# Patient Record
Sex: Male | Born: 1970
Health system: Southern US, Community
[De-identification: ages and names within clinical notes are randomized; demographics above are authoritative.]

## PROBLEM LIST (undated history)

## (undated) DIAGNOSIS — F1011 Alcohol abuse, in remission: Secondary | ICD-10-CM

## (undated) DIAGNOSIS — E7800 Pure hypercholesterolemia, unspecified: Secondary | ICD-10-CM

## (undated) DIAGNOSIS — Z720 Tobacco use: Secondary | ICD-10-CM

## (undated) DIAGNOSIS — K635 Polyp of colon: Secondary | ICD-10-CM

## (undated) DIAGNOSIS — F4310 Post-traumatic stress disorder, unspecified: Secondary | ICD-10-CM

## (undated) DIAGNOSIS — G4700 Insomnia, unspecified: Secondary | ICD-10-CM

## (undated) DIAGNOSIS — F329 Major depressive disorder, single episode, unspecified: Secondary | ICD-10-CM

## (undated) DIAGNOSIS — R569 Unspecified convulsions: Secondary | ICD-10-CM

## (undated) DIAGNOSIS — F1020 Alcohol dependence, uncomplicated: Secondary | ICD-10-CM

## (undated) DIAGNOSIS — K449 Diaphragmatic hernia without obstruction or gangrene: Secondary | ICD-10-CM

## (undated) DIAGNOSIS — G43909 Migraine, unspecified, not intractable, without status migrainosus: Secondary | ICD-10-CM

## (undated) DIAGNOSIS — K2270 Barrett's esophagus without dysplasia: Secondary | ICD-10-CM

## (undated) DIAGNOSIS — G8929 Other chronic pain: Secondary | ICD-10-CM

## (undated) DIAGNOSIS — E781 Pure hyperglyceridemia: Secondary | ICD-10-CM

## (undated) DIAGNOSIS — N400 Enlarged prostate without lower urinary tract symptoms: Secondary | ICD-10-CM

## (undated) DIAGNOSIS — Z8619 Personal history of other infectious and parasitic diseases: Secondary | ICD-10-CM

## (undated) DIAGNOSIS — K5792 Diverticulitis of intestine, part unspecified, without perforation or abscess without bleeding: Secondary | ICD-10-CM

## (undated) DIAGNOSIS — M1990 Unspecified osteoarthritis, unspecified site: Secondary | ICD-10-CM

## (undated) DIAGNOSIS — J309 Allergic rhinitis, unspecified: Secondary | ICD-10-CM

## (undated) DIAGNOSIS — K219 Gastro-esophageal reflux disease without esophagitis: Secondary | ICD-10-CM

## (undated) DIAGNOSIS — I10 Essential (primary) hypertension: Secondary | ICD-10-CM

## (undated) DIAGNOSIS — K5790 Diverticulosis of intestine, part unspecified, without perforation or abscess without bleeding: Secondary | ICD-10-CM

## (undated) DIAGNOSIS — M549 Dorsalgia, unspecified: Secondary | ICD-10-CM

## (undated) DIAGNOSIS — F32A Depression, unspecified: Secondary | ICD-10-CM

## (undated) DIAGNOSIS — M797 Fibromyalgia: Secondary | ICD-10-CM

## (undated) DIAGNOSIS — N189 Chronic kidney disease, unspecified: Secondary | ICD-10-CM

## (undated) DIAGNOSIS — K589 Irritable bowel syndrome without diarrhea: Secondary | ICD-10-CM

## (undated) DIAGNOSIS — G629 Polyneuropathy, unspecified: Secondary | ICD-10-CM

## (undated) HISTORY — DX: Diverticulosis of intestine, part unspecified, without perforation or abscess without bleeding: K57.90

## (undated) HISTORY — DX: Alcohol abuse, in remission: F10.11

## (undated) HISTORY — DX: Diaphragmatic hernia without obstruction or gangrene: K44.9

## (undated) HISTORY — DX: Other chronic pain: G89.29

## (undated) HISTORY — DX: Polyp of colon: K63.5

## (undated) HISTORY — DX: Tobacco use: Z72.0

## (undated) HISTORY — DX: Fibromyalgia: M79.7

## (undated) HISTORY — DX: Unspecified convulsions: R56.9

## (undated) HISTORY — DX: Migraine, unspecified, not intractable, without status migrainosus: G43.909

## (undated) HISTORY — DX: Personal history of other infectious and parasitic diseases: Z86.19

## (undated) HISTORY — DX: Post-traumatic stress disorder, unspecified: F43.10

## (undated) HISTORY — DX: Benign prostatic hyperplasia without lower urinary tract symptoms: N40.0

## (undated) HISTORY — DX: Irritable bowel syndrome, unspecified: K58.9

## (undated) HISTORY — PX: HERNIA REPAIR: SHX51

## (undated) HISTORY — DX: Unspecified osteoarthritis, unspecified site: M19.90

## (undated) HISTORY — DX: Dorsalgia, unspecified: M54.9

## (undated) HISTORY — DX: Allergic rhinitis, unspecified: J30.9

## (undated) HISTORY — DX: Barrett's esophagus without dysplasia: K22.70

## (undated) HISTORY — DX: Pure hyperglyceridemia: E78.1

## (undated) HISTORY — DX: Insomnia, unspecified: G47.00

## (undated) HISTORY — DX: Polyneuropathy, unspecified: G62.9

## (undated) HISTORY — DX: Alcohol dependence, uncomplicated: F10.20

---

## 2004-04-09 ENCOUNTER — Ambulatory Visit: Payer: Self-pay | Admitting: Family Medicine

## 2004-12-13 ENCOUNTER — Emergency Department (HOSPITAL_COMMUNITY): Admission: EM | Admit: 2004-12-13 | Discharge: 2004-12-13 | Payer: Self-pay | Admitting: Emergency Medicine

## 2005-08-10 ENCOUNTER — Emergency Department (HOSPITAL_COMMUNITY): Admission: EM | Admit: 2005-08-10 | Discharge: 2005-08-10 | Payer: Self-pay | Admitting: Family Medicine

## 2005-10-15 ENCOUNTER — Ambulatory Visit: Payer: Self-pay | Admitting: Family Medicine

## 2005-10-29 ENCOUNTER — Ambulatory Visit: Payer: Self-pay | Admitting: Family Medicine

## 2006-11-19 ENCOUNTER — Emergency Department (HOSPITAL_COMMUNITY): Admission: EM | Admit: 2006-11-19 | Discharge: 2006-11-19 | Payer: Self-pay | Admitting: Emergency Medicine

## 2006-12-24 DIAGNOSIS — IMO0001 Reserved for inherently not codable concepts without codable children: Secondary | ICD-10-CM

## 2006-12-24 DIAGNOSIS — F4310 Post-traumatic stress disorder, unspecified: Secondary | ICD-10-CM

## 2006-12-24 DIAGNOSIS — K219 Gastro-esophageal reflux disease without esophagitis: Secondary | ICD-10-CM

## 2006-12-24 DIAGNOSIS — F411 Generalized anxiety disorder: Secondary | ICD-10-CM | POA: Insufficient documentation

## 2006-12-24 DIAGNOSIS — E7800 Pure hypercholesterolemia, unspecified: Secondary | ICD-10-CM | POA: Insufficient documentation

## 2006-12-24 DIAGNOSIS — F17200 Nicotine dependence, unspecified, uncomplicated: Secondary | ICD-10-CM | POA: Insufficient documentation

## 2007-02-22 ENCOUNTER — Emergency Department (HOSPITAL_COMMUNITY): Admission: EM | Admit: 2007-02-22 | Discharge: 2007-02-22 | Payer: Self-pay | Admitting: Emergency Medicine

## 2007-03-23 ENCOUNTER — Emergency Department (HOSPITAL_COMMUNITY): Admission: EM | Admit: 2007-03-23 | Discharge: 2007-03-23 | Payer: Self-pay | Admitting: Emergency Medicine

## 2007-04-07 ENCOUNTER — Ambulatory Visit: Payer: Self-pay | Admitting: Nurse Practitioner

## 2007-04-07 DIAGNOSIS — R197 Diarrhea, unspecified: Secondary | ICD-10-CM

## 2007-04-20 ENCOUNTER — Ambulatory Visit: Payer: Self-pay | Admitting: Family Medicine

## 2007-04-20 LAB — CONVERTED CEMR LAB
ALT: 42 units/L (ref 0–53)
Albumin: 4.3 g/dL (ref 3.5–5.2)
Alkaline Phosphatase: 59 units/L (ref 39–117)
Basophils Absolute: 0 10*3/uL (ref 0.0–0.1)
Basophils Relative: 0 % (ref 0–1)
Bilirubin Urine: NEGATIVE
Blood in Urine, dipstick: NEGATIVE
Eosinophils Absolute: 0.1 10*3/uL (ref 0.0–0.7)
Eosinophils Relative: 2 % (ref 0–5)
Glucose, Bld: 83 mg/dL (ref 70–99)
Glucose, Urine, Semiquant: NEGATIVE
HCT: 47.8 % (ref 39.0–52.0)
Hemoglobin: 15.3 g/dL (ref 13.0–17.0)
Ketones, urine, test strip: NEGATIVE
MCHC: 32 g/dL (ref 30.0–36.0)
MCV: 91.7 fL (ref 78.0–100.0)
Monocytes Absolute: 0.5 10*3/uL (ref 0.1–1.0)
Monocytes Relative: 8 % (ref 3–12)
PSA: 0.42 ng/mL (ref 0.10–4.00)
RBC: 5.21 M/uL (ref 4.22–5.81)
RDW: 14.2 % (ref 11.5–15.5)
Sodium: 141 meq/L (ref 135–145)
Total CHOL/HDL Ratio: 4.5
Triglycerides: 254 mg/dL — ABNORMAL HIGH (ref <150)
Urobilinogen, UA: 0.2
VLDL: 51 mg/dL — ABNORMAL HIGH (ref 0–40)

## 2007-06-17 ENCOUNTER — Ambulatory Visit: Payer: Self-pay | Admitting: Gastroenterology

## 2007-06-17 ENCOUNTER — Encounter (INDEPENDENT_AMBULATORY_CARE_PROVIDER_SITE_OTHER): Payer: Self-pay | Admitting: Family Medicine

## 2007-07-06 ENCOUNTER — Ambulatory Visit: Payer: Self-pay | Admitting: Gastroenterology

## 2007-07-06 ENCOUNTER — Encounter (INDEPENDENT_AMBULATORY_CARE_PROVIDER_SITE_OTHER): Payer: Self-pay | Admitting: Family Medicine

## 2007-07-06 ENCOUNTER — Encounter: Payer: Self-pay | Admitting: Gastroenterology

## 2007-10-18 ENCOUNTER — Telehealth (INDEPENDENT_AMBULATORY_CARE_PROVIDER_SITE_OTHER): Payer: Self-pay | Admitting: *Deleted

## 2008-04-07 ENCOUNTER — Telehealth (INDEPENDENT_AMBULATORY_CARE_PROVIDER_SITE_OTHER): Payer: Self-pay | Admitting: *Deleted

## 2008-11-14 ENCOUNTER — Telehealth (INDEPENDENT_AMBULATORY_CARE_PROVIDER_SITE_OTHER): Payer: Self-pay | Admitting: Family Medicine

## 2009-03-06 ENCOUNTER — Ambulatory Visit: Payer: Self-pay | Admitting: Physician Assistant

## 2009-03-06 DIAGNOSIS — G5600 Carpal tunnel syndrome, unspecified upper limb: Secondary | ICD-10-CM | POA: Insufficient documentation

## 2009-03-06 DIAGNOSIS — R030 Elevated blood-pressure reading, without diagnosis of hypertension: Secondary | ICD-10-CM

## 2009-04-18 ENCOUNTER — Ambulatory Visit: Payer: Self-pay | Admitting: Internal Medicine

## 2009-04-18 ENCOUNTER — Encounter: Payer: Self-pay | Admitting: Physician Assistant

## 2009-04-18 DIAGNOSIS — I10 Essential (primary) hypertension: Secondary | ICD-10-CM | POA: Insufficient documentation

## 2009-04-18 LAB — CONVERTED CEMR LAB
Bilirubin Urine: NEGATIVE
Glucose, Urine, Semiquant: NEGATIVE
Ketones, urine, test strip: NEGATIVE
Protein, U semiquant: NEGATIVE
Specific Gravity, Urine: 1.015
pH: 6

## 2009-04-19 LAB — CONVERTED CEMR LAB
Basophils Absolute: 0 10*3/uL (ref 0.0–0.1)
Basophils Relative: 1 % (ref 0–1)
Calcium: 9.2 mg/dL (ref 8.4–10.5)
Cholesterol: 178 mg/dL (ref 0–200)
Creatinine, Ser: 1 mg/dL (ref 0.40–1.50)
Eosinophils Absolute: 0 10*3/uL (ref 0.0–0.7)
Glucose, Bld: 82 mg/dL (ref 70–99)
Hemoglobin: 14.9 g/dL (ref 13.0–17.0)
Monocytes Absolute: 0.5 10*3/uL (ref 0.1–1.0)
Monocytes Relative: 11 % (ref 3–12)
Neutrophils Relative %: 57 % (ref 43–77)
Platelets: 261 10*3/uL (ref 150–400)
Potassium: 5.5 meq/L — ABNORMAL HIGH (ref 3.5–5.3)
Sodium: 139 meq/L (ref 135–145)
TSH: 1.12 microintl units/mL (ref 0.350–4.500)
Total Bilirubin: 0.7 mg/dL (ref 0.3–1.2)
Total Protein: 7.3 g/dL (ref 6.0–8.3)
WBC: 5 10*3/uL (ref 4.0–10.5)

## 2009-04-25 ENCOUNTER — Ambulatory Visit: Payer: Self-pay | Admitting: Physician Assistant

## 2009-04-26 ENCOUNTER — Telehealth: Payer: Self-pay | Admitting: Physician Assistant

## 2009-04-26 LAB — CONVERTED CEMR LAB
ALT: 270 units/L — ABNORMAL HIGH (ref 0–53)
CO2: 23 meq/L (ref 19–32)
Calcium: 9.2 mg/dL (ref 8.4–10.5)
Chloride: 102 meq/L (ref 96–112)
Cholesterol: 395 mg/dL — ABNORMAL HIGH (ref 0–200)
HDL: 22 mg/dL — ABNORMAL LOW (ref >39)
Sodium: 139 meq/L (ref 135–145)
Total CHOL/HDL Ratio: 18
Triglycerides: 1144 mg/dL — ABNORMAL HIGH (ref <150)

## 2009-04-27 ENCOUNTER — Encounter: Payer: Self-pay | Admitting: Physician Assistant

## 2009-05-01 DIAGNOSIS — R740 Nonspecific elevation of levels of transaminase and lactic acid dehydrogenase [LDH]: Secondary | ICD-10-CM

## 2009-05-02 ENCOUNTER — Ambulatory Visit: Payer: Self-pay | Admitting: Physician Assistant

## 2009-05-02 DIAGNOSIS — E781 Pure hyperglyceridemia: Secondary | ICD-10-CM | POA: Insufficient documentation

## 2009-05-03 LAB — CONVERTED CEMR LAB
ALT: 55 units/L — ABNORMAL HIGH (ref 0–53)
Bilirubin, Direct: 0.2 mg/dL (ref 0.0–0.3)
GGT: 491 units/L — ABNORMAL HIGH (ref 7–51)
HDL: 44 mg/dL (ref >39)
INR: 1.04 (ref <1.50)
Total CHOL/HDL Ratio: 8.5
Triglycerides: 1111 mg/dL — ABNORMAL HIGH (ref <150)

## 2009-05-08 ENCOUNTER — Ambulatory Visit (HOSPITAL_COMMUNITY): Admission: RE | Admit: 2009-05-08 | Discharge: 2009-05-08 | Payer: Self-pay | Admitting: Internal Medicine

## 2009-05-09 DIAGNOSIS — K7689 Other specified diseases of liver: Secondary | ICD-10-CM

## 2009-05-17 ENCOUNTER — Ambulatory Visit: Payer: Self-pay | Admitting: Physician Assistant

## 2009-05-17 DIAGNOSIS — E739 Lactose intolerance, unspecified: Secondary | ICD-10-CM

## 2009-05-17 LAB — CONVERTED CEMR LAB
HDL goal, serum: 40 mg/dL
LDL Goal: 130 mg/dL

## 2009-06-05 ENCOUNTER — Encounter: Payer: Self-pay | Admitting: Physician Assistant

## 2009-06-05 ENCOUNTER — Ambulatory Visit: Payer: Self-pay | Admitting: Physician Assistant

## 2009-06-06 ENCOUNTER — Encounter: Payer: Self-pay | Admitting: Physician Assistant

## 2009-06-07 LAB — CONVERTED CEMR LAB
AST: 41 units/L — ABNORMAL HIGH (ref 0–37)
CO2: 21 meq/L (ref 19–32)
Calcium: 9.3 mg/dL (ref 8.4–10.5)
Chloride: 99 meq/L (ref 96–112)
Cholesterol: 338 mg/dL — ABNORMAL HIGH (ref 0–200)
Potassium: 4.3 meq/L (ref 3.5–5.3)
Sodium: 136 meq/L (ref 135–145)
Total Bilirubin: 1.6 mg/dL — ABNORMAL HIGH (ref 0.3–1.2)
Triglycerides: 963 mg/dL — ABNORMAL HIGH (ref <150)

## 2009-08-07 ENCOUNTER — Ambulatory Visit: Payer: Self-pay | Admitting: Physician Assistant

## 2009-08-08 ENCOUNTER — Encounter: Payer: Self-pay | Admitting: Physician Assistant

## 2009-08-09 LAB — CONVERTED CEMR LAB
ALT: 35 units/L (ref 0–53)
Albumin: 4.5 g/dL (ref 3.5–5.2)
CO2: 21 meq/L (ref 19–32)
Creatinine, Ser: 1.11 mg/dL (ref 0.40–1.50)
Glucose, Bld: 78 mg/dL (ref 70–99)
HDL: 66 mg/dL (ref >39)
Total Bilirubin: 0.9 mg/dL (ref 0.3–1.2)
Total CHOL/HDL Ratio: 4

## 2009-08-10 ENCOUNTER — Encounter: Payer: Self-pay | Admitting: Physician Assistant

## 2009-08-10 ENCOUNTER — Encounter (INDEPENDENT_AMBULATORY_CARE_PROVIDER_SITE_OTHER): Payer: Self-pay | Admitting: *Deleted

## 2009-08-14 ENCOUNTER — Encounter: Payer: Self-pay | Admitting: Physician Assistant

## 2009-09-06 ENCOUNTER — Telehealth: Payer: Self-pay | Admitting: Physician Assistant

## 2009-10-18 ENCOUNTER — Telehealth: Payer: Self-pay | Admitting: Physician Assistant

## 2009-11-16 ENCOUNTER — Telehealth (INDEPENDENT_AMBULATORY_CARE_PROVIDER_SITE_OTHER): Payer: Self-pay | Admitting: *Deleted

## 2009-11-16 ENCOUNTER — Ambulatory Visit: Payer: Self-pay | Admitting: Physician Assistant

## 2009-11-16 DIAGNOSIS — IMO0002 Reserved for concepts with insufficient information to code with codable children: Secondary | ICD-10-CM

## 2009-11-16 LAB — CONVERTED CEMR LAB
ALT: 21 units/L (ref 0–53)
AST: 23 units/L (ref 0–37)
Albumin: 4.4 g/dL (ref 3.5–5.2)
Alkaline Phosphatase: 52 units/L (ref 39–117)
Creatinine, Ser: 0.77 mg/dL (ref 0.40–1.50)
Hgb A1c MFr Bld: 5.3 %
LDL Cholesterol: 107 mg/dL — ABNORMAL HIGH (ref 0–99)
Total Bilirubin: 0.6 mg/dL (ref 0.3–1.2)
Total Protein: 6.9 g/dL (ref 6.0–8.3)
VLDL: 25 mg/dL (ref 0–40)

## 2009-11-18 ENCOUNTER — Telehealth (INDEPENDENT_AMBULATORY_CARE_PROVIDER_SITE_OTHER): Payer: Self-pay | Admitting: *Deleted

## 2009-11-18 ENCOUNTER — Encounter: Payer: Self-pay | Admitting: Physician Assistant

## 2009-12-14 ENCOUNTER — Encounter: Payer: Self-pay | Admitting: Physician Assistant

## 2010-01-01 ENCOUNTER — Telehealth: Payer: Self-pay | Admitting: Physician Assistant

## 2010-01-18 ENCOUNTER — Telehealth (INDEPENDENT_AMBULATORY_CARE_PROVIDER_SITE_OTHER): Payer: Self-pay | Admitting: Nurse Practitioner

## 2010-03-08 ENCOUNTER — Emergency Department (HOSPITAL_COMMUNITY)
Admission: EM | Admit: 2010-03-08 | Discharge: 2010-03-08 | Payer: Self-pay | Source: Home / Self Care | Admitting: Emergency Medicine

## 2010-04-04 ENCOUNTER — Ambulatory Visit: Admit: 2010-04-04 | Payer: Self-pay | Admitting: Nurse Practitioner

## 2010-04-23 NOTE — Letter (Signed)
Summary: REFERRAL//DIABETIC CLINIC//APPT DATE & TIME  REFERRAL//DIABETIC CLINIC//APPT DATE & TIME   Imported By: Arta Bruce 07/10/2009 14:58:19  _____________________________________________________________________  External Attachment:    Type:   Image     Comment:   External Document

## 2010-04-23 NOTE — Assessment & Plan Note (Signed)
Summary: F/U BP///////RJP   Vital Signs:  Patient profile:   40 year old male Height:      70.25 inches Weight:      203 pounds BMI:     29.03 Temp:     97.8 degrees F oral Pulse rate:   75 / minute Pulse rhythm:   regular Resp:     18 per minute BP sitting:   117 / 73  (left arm) Cuff size:   regular  Vitals Entered By: [place] Shannon (May 17, 2009 11:15 AM) CC: f/u bp...., Hypertension Management, Lipid Management Is Patient Diabetic? No  Does patient need assistance? Functional Status Self care Ambulation Normal   Primary Care Provider:  Tereso Newcomer PA-C  CC:  f/u bp...., Hypertension Management, and Lipid Management.  History of Present Illness: Here for f/u. In follow up of all his tests recently, his LFTs were up as well as his triglycerides (>1000). His u/s showed fatty liver. His A1C is 6. I suspect he likely has metabolic syndrome. He is now on Tricor.  He had some nausea and diarrhea at first.  But, this has resolved. He always has this with new meds. He has stopped using table salt. 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  No longer eating there. He has changed his diet. He is due to see our dietician soon.  No appt yet. He is walking more and is trying to lose weight.   Hypertension History:      He denies headache, chest pain, palpitations, dyspnea with exertion, peripheral edema, and syncope.        Positive major cardiovascular risk factors include hyperlipidemia, hypertension, and family history for ischemic heart disease (females less than 91 years old & males less than 60 years old).  Negative major cardiovascular risk factors include male age less than 54 years old, no history of diabetes, and non-tobacco-user status.        Further assessment for target organ damage reveals no history of ASHD, stroke/TIA, or peripheral vascular disease.    Lipid Management History:      Positive NCEP/ATP III risk factors include  family history for ischemic heart disease (females less than 38 years old & males less than 21 years old) and hypertension.  Negative NCEP/ATP III risk factors include male age less than 47 years old, non-diabetic, non-tobacco-user status, no ASHD (atherosclerotic heart disease), no prior stroke/TIA, no peripheral vascular disease, and no history of aortic aneurysm.     Current Medications (verified): 1)  Prilosec 20 Mg  Cpdr (Omeprazole) .Marland Kitchen.. 1 By Mouth Once Daily 2)  Alprazolam 2 Mg  Tabs (Alprazolam) .... Take 1 By Mouth Daily 3)  Prozac 20 Mg  Caps (Fluoxetine Hcl) .... Take One Tablet Daily 4)  Ambien 10 Mg  Tabs (Zolpidem Tartrate) .... Take 1/2-1 Tablet By Mouth At Bedtime Every Other Day 5)  Trazodone Hcl 50 Mg  Tabs (Trazodone Hcl) .... Take 1 By Mouth Every Other Day 6)  Bilateral Carpal Tunnel Braces .... Wear Every Night To Bed 7)  Norvasc 5 Mg Tabs (Amlodipine Besylate) .... Take 1 Tablet By Mouth Once A Day For Blood Pressure 8)  Tricor 145 Mg Tabs (Fenofibrate) .... Take 1 Tablet By Mouth Once A Day For Cholesterol  Allergies (verified): 1)  ! Percocet 2)  ! Vicodin  Past History:  Past Medical History: PTSD Fibromyalgia Right exotropia Quit Tobacco 02/27/2005 GERD   a.  EGD 06/2007:  Barrets Esophagus; Hiatal Hernia h/o hematochezia  a.  [place]. 06/2007:  Diverticulosis Fatty Liver (ultrasound 04/2009) Hypertriglyceridemia Glucose intolerance (A1C 6.0 04/2009)   a.  ? metabolic syndrome  Physical Exam  General:  alert, well-developed, and well-nourished.   Head:  normocephalic and atraumatic.   Lungs:  normal breath sounds, no crackles, and no wheezes.   Heart:  normal rate and regular rhythm.   Abdomen:  soft, non-tender, and no hepatomegaly.   Neurologic:  alert & oriented X3 and cranial nerves II-XII intact.   Psych:  normally interactive.     Impression & Recommendations:  Problem # 1:  HYPERTRIGLYCERIDEMIA (ICD-272.1) recheck lipids and LFTs after 1 mo of  therapy  His updated medication list for this problem includes:    Tricor 145 Mg Tabs (Fenofibrate) .Marland Kitchen... Take 1 tablet by mouth once a day for cholesterol  Problem # 2:  FATTY LIVER DISEASE (ICD-571.8) as above discussed diet treat chol f/u on sugars  Orders: Diabetic Clinic Referral (Diabetic)  Problem # 3:  HYPERTENSION (ICD-401.9) better patient watching diet and exercising   His updated medication list for this problem includes:    Norvasc 5 Mg Tabs (Amlodipine besylate) .Marland Kitchen... Take 1 tablet by mouth once a day for blood pressure  Problem # 4:  GLUCOSE INTOLERANCE (ICD-271.3) ? metabolic syndrome consider metformin if his A1C does not go down advised him to take ASA 81 mg once daily given his FHx, HTN, cholesterol, etc. check A1C at f/u visit  Complete Medication List: 1)  Prilosec 20 Mg Cpdr (Omeprazole) .Marland Kitchen.. 1 by mouth once daily 2)  Alprazolam 2 Mg Tabs (Alprazolam) .... Take 1 by mouth daily 3)  Prozac 20 Mg Caps (Fluoxetine hcl) .... Take one tablet daily 4)  Ambien 10 Mg Tabs (Zolpidem tartrate) .... Take 1/2-1 tablet by mouth at bedtime every other day 5)  Trazodone Hcl 50 Mg Tabs (Trazodone hcl) .... Take 1 by mouth every other day 6)  Bilateral Carpal Tunnel Braces  .... Wear every night to bed 7)  Norvasc 5 Mg Tabs (Amlodipine besylate) .... Take 1 tablet by mouth once a day for blood pressure 8)  Tricor 145 Mg Tabs (Fenofibrate) .... Take 1 tablet by mouth once a day for cholesterol  Hypertension Assessment/Plan:      The patient's hypertensive risk group is category B: At least one risk factor (excluding diabetes) with no target organ damage.  Today's blood pressure is 117/73.  His blood pressure goal is < 140/90.  Lipid Assessment/Plan:      Based on NCEP/ATP III, the patient's risk factor category is "2 or more risk factors and a calculated 10 year CAD risk of > 20%".  The patient's lipid goals are as follows: Total cholesterol goal is 200; LDL cholesterol  goal is 130; HDL cholesterol goal is 40; Triglyceride goal is 150.     Patient Instructions: 1)  Schedule appointment with Drucilla Schmidt for education on diet. 2)  Return to the lab on or around March 15 for: CMET, Lipids. 3)  Come fasting for labs (nothing to eat or drink after midnight except water). 4)  Please schedule a follow-up appointment in 3 months with Scott for blood pressure and cholesterol.  We will check your hemoglobin A1C again that day (test for your sugar).  5)  It is important that you exercise reguarly at least 20 minutes 5 times a week. If you develop chest pain, have severe difficulty breathing, or feel very tired, stop exercising immediately and seek medical attention.  Vital Signs:  Patient profile:   40 year old male Height:      70.25 inches Weight:      203 pounds BMI:     29.03 Temp:     97.8 degrees F oral Pulse rate:   75 / minute Pulse rhythm:   regular Resp:     18 per minute BP sitting:   117 / 73  (left arm) Cuff size:   regular  Vitals Entered By: [place] Shannon (May 17, 2009 11:15 AM)

## 2010-04-23 NOTE — Progress Notes (Signed)
Summary: PT REFERRAL   Phone Note Outgoing Call   Summary of Call: Schedule repeat FLP and LFTs in 6 mos. Initial call taken by: Brynda Rim,  November 18, 2009 9:07 PM  Follow-up for Phone Call        PT HAVE AN APPT [facility]  12-04-09 @ 8:30AM LVM TO PT TO RETURN MY CALL Follow-up by: Cheryll Dessert,  November 20, 2009 12:44 PM

## 2010-04-23 NOTE — Letter (Signed)
Summary: Letter//lab letter  Letter//lab letter   Imported By: Arta Bruce 08/10/2009 11:28:54  _____________________________________________________________________  External Attachment:    Type:   Image     Comment:   External Document

## 2010-04-23 NOTE — Progress Notes (Signed)
Summary: OMEPRAZOLE REFILL   Phone Note From Other Clinic   Caller: [facility] Summary of Call: CALLED TO GET REFILL FOR OMEPRAZOLE 20 MG LAST FILLED 07/30/2009. [phone] Initial call taken by: Levon Hedger,  September 06, 2009 5:40 PM  Follow-up for Phone Call        Rx in basket to fax to his pharmacy. Follow-up by: Tereso Newcomer PA-C,  September 07, 2009 8:58 AM  Additional Follow-up for Phone Call Additional follow up Details #1::        Rx faxed to [facility]. Gaylyn Cheers RN  September 07, 2009 11:53 AM     Prescriptions: PRILOSEC 20 MG  CPDR (OMEPRAZOLE) 1 by mouth once daily  #30 x 11   Entered and Authorized by:   Tereso Newcomer PA-C   Signed by:   Tereso Newcomer PA-C on 09/07/2009   Method used:   Printed then faxed to ...       [facility] (retail)       [address]       [address]       [place], [place]  [address]       Ph: [phone]       Fax: [phone]   RxID:   [id number]

## 2010-04-23 NOTE — Assessment & Plan Note (Signed)
Summary: discuss labs///cns   Vital Signs:  Patient profile:   40 year old male Height:      70.25 inches Weight:      193 pounds BMI:     27.60 Temp:     98.0 degrees F oral Pulse rate:   102 / minute Pulse rhythm:   regular Resp:     18 per minute BP sitting:   133 / 88  (left arm) Cuff size:   regular  Vitals Entered By: [place] Shannon (May 02, 2009 10:49 AM) CC: pt says while on the bp med he feels like he has no energy....  pt said when first starting the bp med that his chest was hurting but its not any more.... pt says the med is getting better..., Hypertension Management Is Patient Diabetic? No Pain Assessment Patient in pain? no       Does patient need assistance? Functional Status Self care Ambulation Normal   CC:  pt says while on the bp med he feels like he has no energy....  pt said when first starting the bp med that his chest was hurting but its not any more.... pt says the med is getting better... and Hypertension Management.  History of Present Illness: 40 year old male presents for followup.  As noted in the chart his blood work came back from his physical with a mildly elevated AST.  His triglycerides were significantly elevated at 644.  His LDL was not calculated.  Followup blood work demonstrated a triglyceride level of 1144.  His liver enzymes were also significantly elevated with an AST of 234 and an ALT of 270.  He had a hepatitis panel obtained.  Of note, this was an acute hepatitis panel.  This was negative for hepatitis B surface antigen, hepatitis B core antibody IgM and hepatitis C antibody.  In the office today, he notes that he is feeling well.  He did have some side effects to the Norvasc when he first started it.  He felt somewhat fatigued and also noted some atypical chest pain.  He denies any exertional chest discomfort.  He denies any exertional shortness of breath.  He denies any associated nausea or diaphoresis.  He denies any associated  radiating symptoms.  The symptoms have since subsided since he started the medication.  He is now taking the Norvasc at bedtime and feels better.  He denies any nausea or vomiting.  He denies any flulike symptoms.  He denies any fevers or chills.  He denies any diarrhea.  He does note that his diet is poor.  He does eat a lot of carbohydrates.  He has cut way back on his alcohol since I last spoke to him.  Hypertension History:      He complains of chest pain, but denies dyspnea with exertion and syncope.        Positive major cardiovascular risk factors include hyperlipidemia, hypertension, and family history for ischemic heart disease (females less than 27 years old & males less than 82 years old).  Negative major cardiovascular risk factors include male age less than 2 years old and non-tobacco-user status.    Current Medications (verified): 1)  Prilosec 20 Mg  Cpdr (Omeprazole) .Marland Kitchen.. 1 By Mouth Once Daily 2)  Alprazolam 2 Mg  Tabs (Alprazolam) .... Take 1 By Mouth Daily 3)  Prozac 20 Mg  Caps (Fluoxetine Hcl) .... Take One Tablet Daily 4)  Ambien 10 Mg  Tabs (Zolpidem Tartrate) .... Take 1/2-1 Tablet By Mouth  At Bedtime Every Other Day 5)  Trazodone Hcl 50 Mg  Tabs (Trazodone Hcl) .... Take 1 By Mouth Every Other Day 6)  Bilateral Carpal Tunnel Braces .... Wear Every Night To Bed 7)  Norvasc 5 Mg Tabs (Amlodipine Besylate) .... Take 1 Tablet By Mouth Once A Day For Blood Pressure  Allergies (verified): 1)  ! Percocet 2)  ! Vicodin  Family History: Mother living HTN Father died at 53 DM,MI,alcoholic 1 brother living 78 Marfans 3 sisters...2 with DM,1 with lupus, one sis had a stroke at 48 No h/o colon cancer Paternal uncles ?prostate cancer. Family History of CAD Male 1st degree relative <50 (died at 69 of MI) Family History of CAD Male 1st degree relative <50  Physical Exam  General:  alert, well-developed, and well-nourished.   Head:  normocephalic and atraumatic.   Neck:  no  jvd Lungs:  normal breath sounds, no crackles, and no wheezes.   Heart:  normal rate and regular rhythm.   Abdomen:  soft, non-tender, normal bowel sounds, no distention, no guarding, no rebound tenderness, no hepatomegaly, and no splenomegaly.   Neurologic:  alert & oriented X3 and cranial nerves II-XII intact.   Psych:  normally interactive.     Impression & Recommendations:  Problem # 1:  TRANSAMINASES, SERUM, ELEVATED (ICD-790.4) suspect he has fatty liver  recheck labs today abdominal ultrasound ordered   Orders: T-Hepatic Function [phone]) T-Gamma GT (GGT) [phone]) T-Protime, Auto [phone]) T- Hemoglobin A1C ([id number]) T-Lipase (44010-27253) T-Amylase (66440-34742)  Problem # 2:  HYPERTRIGLYCERIDEMIA (ICD-272.1) as above discussed his diet with him at length today rec. he decrease carb intake look into getting the [facility] Diet book will likely start on statin or fibrate has a strong FHx of vascular disease await further w/u before recommending +/- daily ASA  Orders: T-Hepatic Function [phone]) T-Gamma GT (GGT) [phone]) T-Protime, Auto [phone]) T- Hemoglobin A1C (09323-55732) T-Lipase (20254-27062) T-Amylase (37628-31517) T-Lipid Profile (61607-37106)  Problem # 3:  HYPERTENSION (ICD-401.9) bp better had some side effects initially but getting better and seems to be tolerating the med.  His updated medication list for this problem includes:    Norvasc 5 Mg Tabs (Amlodipine besylate) .Marland Kitchen... Take 1 tablet by mouth once a day for blood pressure  Complete Medication List: 1)  Prilosec 20 Mg Cpdr (Omeprazole) .Marland Kitchen.. 1 by mouth once daily 2)  Alprazolam 2 Mg Tabs (Alprazolam) .... Take 1 by mouth daily 3)  Prozac 20 Mg Caps (Fluoxetine hcl) .... Take one tablet daily 4)  Ambien 10 Mg Tabs (Zolpidem tartrate) .... Take 1/2-1 tablet by mouth at bedtime every other day 5)  Trazodone Hcl 50 Mg Tabs (Trazodone hcl) .... Take 1 by mouth  every other day 6)  Bilateral Carpal Tunnel Braces  .... Wear every night to bed 7)  Norvasc 5 Mg Tabs (Amlodipine besylate) .... Take 1 tablet by mouth once a day for blood pressure  Hypertension Assessment/Plan:      The patient's hypertensive risk group is category B: At least one risk factor (excluding diabetes) with no target organ damage.  Today's blood pressure is 133/88.  His blood pressure goal is < 140/90.   Patient Instructions: 1)  Follow up with Uvaldo Rybacki as scheduled. 2)  Follow a low cholesterol diet. 3)  Try to get the book:  "The [facility] Diet." 4)  Avoid alcohol. 5)  Avoid Motrin, etc. for now. 6)  Use tylenol in very limited amounts as needed.  You could take a maximum  of 500 mg 4 times a day at this point.  Appended Document: discuss labs///cns Patient: Russell Adams Note: All result statuses are Final unless otherwise noted.  Tests: (1) PT (Prothrombin Time) (66440)  PT (Prothrombin Time)                             13.5 seconds                11.6-15.2   INR                       1.04                        <1.50     The INR is of principal utility in following patients on stable doses     of oral anticoagulants.  The therapeutic range is generally 2.0 to     3.0, but may be 3.0 to 4.0 in patients with mechanical cardiac valves,     recurrent embolisms and antiphospholipid antibodies (including lupus     inhibitors).  Tests: (2) Lipid Profile (34742)   Cholesterol          [H]  375 mg/dL                   5-956     ATP III Classification:           < 200        mg/dL        Desirable          200 - 239     mg/dL        Borderline High          >= 240        mg/dL        High         Triglyceride         [H]  1111 mg/dL                  <387     Result repeated and verified.     Result confirmed by automatic dilution.   HDL Cholesterol           44 mg/dL                    >56   Total Chol/HDL Ratio      8.5 Ratio  VLDL Cholesterol (Calc)                              NOT CALC mg/dL              4-33           Not calculated due to Triglyceride >400.     Suggest ordering Direct LDL (Unit Code: 29518).  LDL Cholesterol (Calc)                             NOT CALC mg/dL              8-41           Not calculated due to Triglyceride >400.     Suggest ordering Direct LDL (Unit Code: 66063).           Total Cholesterol/HDL Ratio:CHD Risk  Coronary Heart Disease Risk Table                                            Men       Women              1/2 Average Risk              3.4        3.3                  Average Risk              5.0        4.4              2 X Average Risk              9.6        7.1              3 X Average Risk             23.4       11.0     Use the calculated Patient Ratio above and the CHD Risk table      to determine the patient's CHD Risk.     ATP III Classification (LDL):           < 100        mg/dL         Optimal          100 - 129     mg/dL         Near or Above Optimal          130 - 159     mg/dL         Borderline High          160 - 189     mg/dL         High           > 190        mg/dL         Very High        Tests: (3) Liver Profile (04540)   Bilirubin, Total     [H]  1.3 mg/dL                   9.8-1.1   Bilirubin, Direct         0.2 mg/dL                   9.1-4.7   Indirect Bilirubin   [H]  1.1 mg/dL                   8.2-9.5   Alkaline Phosphatase      95 U/L                      39-117   AST/SGOT             [H]  40 U/L                      0-37   ALT/SGPT             [H]  55 U/L  0-53   Total Protein             8.2 g/dL                    1.6-1.0   Albumin                   4.6 g/dL                    9.6-0.4  Tests: (4) Gamma GT (GGT) (23130)   Gamma GT (GGT)       [H]  491 U/L                     7-51  Tests: (5) Amylase (23210)   Amylase                   44 U/L                      0-105  Tests: (6) Lipase (54098)   Lipase                     40 U/L                      0-75  Tests: (7) Hemoglobin A1C (23375)   Hemoglobin A1C            6.0 %                       4.6-6.1     The ADA recommends the following therapeutic goal for glycemic control     related to Hgb A1c measurement:     Goal of therapy: <6.5 Hgb A1c           Reference: American Diabetes Association: Clinical Practice     Recommendations 2010, Diabetes Care, 2010, 33: (Suppl 1). ! Estimated Average Glucose                             126 mg/dL                   None avail  Note: An exclamation mark (!) indicates a result that was not dispersed into the flowsheet. Document Creation Date: 05/03/2009 1:03 AM _______________________________________________________________________  (1) Order result status: Final Collection or observation date-time: 05/02/2009 21:46 Requested date-time: 05/02/2009 12:31 Receipt date-time: 05/02/2009 21:46 Reported date-time: 05/03/2009 01:03 Referring Physician:   Ordering Physician:  Alben Spittle [phone]) Specimen Source:  Source: Lajean Silvius Order Number: [id number] Lab site: [facility], [facility]     [address], [address]     [place]  [place]  [address]  (2) Order result status: Final Collection or observation date-time: 05/02/2009 21:46 Requested date-time: 05/02/2009 12:31 Receipt date-time: 05/02/2009 21:46 Reported date-time: 05/03/2009 01:03 Referring Physician:   Ordering Physician:  Alben Spittle [phone]) Specimen Source:  Source: Lajean Silvius Order Number: [id number] Lab site: [facility], [facility]     [address], [address]     [place]  [place]  [address]  (3) Order result status: Final Collection or observation date-time: 05/02/2009 21:46 Requested date-time: 05/02/2009 12:31 Receipt date-time: 05/02/2009 21:46 Reported date-time: 05/03/2009 01:03 Referring Physician:   Ordering Physician:  Alben Spittle [phone]) Specimen Source:  Source: Lajean Silvius Order Number: [id number] Lab  site: [facility], [facility]     [address], [address]     [place]  [place]  [address]  (4) Order result status: Final Collection or observation date-time: 05/02/2009 21:46 Requested date-time: 05/02/2009 12:31 Receipt date-time: 05/02/2009 21:46 Reported date-time: 05/03/2009 01:03 Referring Physician:   Ordering Physician:  Alben Spittle [phone]) Specimen Source:  Source: Lajean Silvius Order Number: [id number] Lab site: [facility], [facility]     [address]     [place]  [place]  [address]  (5) Order result status: Final Collection or observation date-time: 05/02/2009 21:46 Requested date-time: 05/02/2009 12:31 Receipt date-time: 05/02/2009 21:46 Reported date-time: 05/03/2009 01:03 Referring Physician:   Ordering Physician:  Alben Spittle [phone]) Specimen Source:  Source: Lajean Silvius Order Number: [id number] Lab site: [facility], [facility]     [address], [address]     [place]  [place]  [address]  (6) Order result status: Final Collection or observation date-time: 05/02/2009 21:46 Requested date-time: 05/02/2009 12:31 Receipt date-time: 05/02/2009 21:46 Reported date-time: 05/03/2009 01:03 Referring Physician:   Ordering Physician:  Alben Spittle [phone]) Specimen Source:  Source: Lajean Silvius Order Number: [id number] Lab site: [facility], [facility]     [address]     [place]  [place]  [address]  (7) Order result status: Final Collection or observation date-time: 05/02/2009 21:46 Requested date-time: 05/02/2009 12:31 Receipt date-time: 05/02/2009 21:46 Reported date-time: 05/03/2009 01:03 Referring Physician:   Ordering Physician:  Alben Spittle [phone]) Specimen Source:  Source: [facility] Order Number: [id number] Lab site: [facility], [facility]     [address], [address]     [place]  [place]  [address]   -----------------  The following non-numeric lab results were dispersed to the flowsheet even  though numeric results were expected:    VLDL Cholesterol (Calc), NOT CALC   LDL Cholesterol (Calc), NOT CALC   Signed by Tereso Newcomer PA-C on 05/03/2009 at 5:37 PM  ________________________________________________________________________  LFTs better Chol and Trigs still way out of control INR normal Lipase ok A1C 6 . . . Glucose Intol (?metabolic syndrome) Will start on Tricor . . . will likely add fish oil as well May end up adding a statin as well. Refer to dietician for intensive lifestyle changes Cont to manage BP Consider metformin if A1C remains above 6 Ultrasound still pending (?fatty liver)   [place], I want to start him on Tricor 145 mg once daily. He will need fasting Lipids and LFTs in 4 weeks. Find out what pharmacy to send to. Make sure he does his ultrasound. Have him see Susie Piper to discuss diet.  Let him know that he is glucose intolerant. Tell him his LFTs are better.  Trig's still over 1000.  . . so needs treatment. Make sure he keeps f/u later this month to discuss.     Clinical Lists Changes

## 2010-04-23 NOTE — Letter (Signed)
Summary: *HSN Results Follow up  [facility]  [address] [place], [place] [address]   Phone: [phone]  Fax: [phone]      08/10/2009   KAZUMI LACHNEY [address] [place], [place]  [address]   Dear  Mr. Marilynn Latino,                            ____S.Drinkard,FNP   __X__S. Alben Spittle, PA       ____B. McPherson,MD   ____V. Rankins,MD    ____E. Mulberry,MD    ____N. Daphine Deutscher, FNP  ____D. Reche Dixon, MD    ____K. Philipp Deputy, MD    ____Other     This letter is to inform you that your recent test(s):  _______Pap Smear    __X_____Lab Test     _______X-ray    _______ is within acceptable limits  _______ requires a medication change  ____X__requires a follow-up lab visit  _______ requires a follow-up visit with your provider   Comments:  Your Triglycerides look much better -- continue Tricor.  Your provider wants you to have follow-up labs done in three months.  Start on Fish oil 1000 International Units,  1 capsule once daily for 4 weeks, then increase to 2 caps once daily (you can get over the counter). When taking other medications, take this medication separately.  I've made an appointment for you to have the labs done on November 13, 2009 at 8:30 am.  If this appointment needs to be changed, please notify our office.     _________________________________________________________ If you have any questions, please contact our office                     Sincerely,  Dutch Quint RN [facility]

## 2010-04-23 NOTE — Progress Notes (Signed)
Summary: PT referral   Phone Note Outgoing Call   Summary of Call: Refer to PT for right shoulder pain Initial call taken by: Brynda Rim,  November 16, 2009 11:54 AM  Follow-up for Phone Call        SEND A REFERRAL WAITING FOR AN APPT   Follow-up by: Cheryll Dessert,  November 20, 2009 5:18 PM

## 2010-04-23 NOTE — Letter (Signed)
Summary: *HSN Results Follow up  [facility]  [address] [place], [place] [address]   Phone: [phone]  Fax: [phone]      11/18/2009   Russell Adams [address] [place], [place]  [address]   Dear  Mr. Russell Adams,                            ____S.Drinkard,FNP   ____D. Gore,FNP       ____B. McPherson,MD   ____V. Rankins,MD    ____E. Mulberry,MD    ____N. Daphine Deutscher, FNP  ____D. Reche Dixon, MD    ____K. Philipp Deputy, MD    __x__S. Alben Spittle, PA-C     This letter is to inform you that your recent test(s):  _______Pap Smear    ___x____Lab Test     _______X-ray    _______ is normal  _______ requires a medication change  _______ requires a follow-up lab visit  _______ requires a follow-up visit with your provider   Comments: Your cholesterol numbers look great!!  Your Total cholesterol is 182, Triglycerides 125 (!!!!), HDL 50 and LDL 107.  Your liver enzymes are normal.  This is awesome.  Continue your same medicines and keep up the good work.  Congratulations!       _________________________________________________________ If you have any questions, please contact our office                     Sincerely,  Tereso Newcomer PA-C [facility]

## 2010-04-23 NOTE — Assessment & Plan Note (Signed)
Summary: OV per Lorin Picket, FLP,LFT//mm   Vital Signs:  Patient profile:   40 year old male Height:      70.25 inches Weight:      195 pounds BMI:     27.88 Temp:     98.1 degrees F oral Pulse rate:   76 / minute Pulse rhythm:   regular Resp:     18 per minute BP sitting:   110 / 82  (left arm) Cuff size:   regular  Vitals Entered By: Armenia Shannon (November 16, 2009 10:37 AM) CC: pt says his right shoulder is hurting all the time.... pt says he gets a shock in his body.pt says he woudl like a muscle relaxer or referral for PT.... pt says his allergies is brothering him..., Hypertension Management Is Patient Diabetic? No Pain Assessment Patient in pain? no       Does patient need assistance? Functional Status Self care Ambulation Normal   Primary Care Provider:  Tereso Newcomer PA-C  CC:  pt says his right shoulder is hurting all the time.... pt says he gets a shock in his body.pt says he woudl like a muscle relaxer or referral for PT.... pt says his allergies is brothering him... and Hypertension Management.  History of Present Illness: Here for f/u.  Lost 8 pounds. Has a lot of allergies and using zyrtec.  This is helping a lot.  No wheezing.  Coughing but this is better. No fevers or chills.   C/o right shoulder pain for about 6 months now.  Points to his trapezius muscle.  No injury.  Raising his arm above his head makes worse.  No radicular symptoms.  No neck pain.  Has tried tylenol without success.  Has tried to use a tennis ball to massage his shoulder.   Hypertension History:      He denies chest pain, dyspnea with exertion, and syncope.        Positive major cardiovascular risk factors include hyperlipidemia, hypertension, and family history for ischemic heart disease (females less than 54 years old & males less than 53 years old).  Negative major cardiovascular risk factors include male age less than 86 years old, no history of diabetes, and non-tobacco-user status.     Further assessment for target organ damage reveals no history of ASHD, stroke/TIA, or peripheral vascular disease.     Current Medications (verified): 1)  Prilosec 20 Mg  Cpdr (Omeprazole) .Marland Kitchen.. 1 By Mouth Once Daily 2)  Alprazolam 2 Mg  Tabs (Alprazolam) .... Take 1 By Mouth Daily 3)  Prozac 20 Mg  Caps (Fluoxetine Hcl) .... Take Two Tablet Daily 4)  Ambien 10 Mg  Tabs (Zolpidem Tartrate) .... Take 1 Tablet By Mouth At Bedtime Every Other Day 5)  Trazodone Hcl 50 Mg  Tabs (Trazodone Hcl) .... Take 1 By Mouth Every Other Day 6)  Bilateral Carpal Tunnel Braces .... Wear Every Night To Bed 7)  Norvasc 5 Mg Tabs (Amlodipine Besylate) .... Take 1 Tablet By Mouth Once A Day For Blood Pressure 8)  Tricor 145 Mg Tabs (Fenofibrate) .... Take 1 Tablet By Mouth Once A Day For Cholesterol  Allergies (verified): 1)  ! Percocet 2)  ! Vicodin  Physical Exam  General:  alert, well-developed, and well-nourished.   Head:  normocephalic and atraumatic.   Eyes:  no xanthelasma Neck:  supple.   Lungs:  normal breath sounds.   Heart:  normal rate and regular rhythm.   Msk:  right shoulder: + pain over  right trap muscle with palp full A/P ROM no AC joint tend no bicipital groove tend no crepitus  Extremities:  no edema Neurologic:  alert & oriented X3 and cranial nerves II-XII intact biceps and brachioradialis DTRs 2+ bilat strength in BUE normal and equal in all muscle groups bilat    Psych:  normally interactive.     Impression & Recommendations:  Problem # 1:  HYPERTRIGLYCERIDEMIA (ICD-272.1) not taking fish oil . . .cannot afford tol meds has lost weight and changed diet  His updated medication list for this problem includes:    Tricor 145 Mg Tabs (Fenofibrate) .Marland Kitchen... Take 1 tablet by mouth once a day for cholesterol  Orders: T-Comprehensive Metabolic Panel [id number]) T-Lipid Profile ([address]073-71062)  Problem # 2:  HYPERTENSION (ICD-401.9) controlled  His updated medication list  for this problem includes:    Norvasc 5 Mg Tabs (Amlodipine besylate) .Marland Kitchen... Take 1 tablet by mouth once a day for blood pressure  Problem # 3:  SHOULDER STRAIN, RIGHT (ICD-840.9)  chronic trapezius strain NSAIDs, muscle relaxers, ice/heat with chronicity, will send to PT  Orders: Physical Therapy Referral (PT)  Complete Medication List: 1)  Prilosec 20 Mg Cpdr (Omeprazole) .Marland Kitchen.. 1 by mouth once daily 2)  Alprazolam 2 Mg Tabs (Alprazolam) .... Take 1 by mouth daily 3)  Prozac 20 Mg Caps (Fluoxetine hcl) .... Take two tablet daily 4)  Ambien 10 Mg Tabs (Zolpidem tartrate) .... Take 1 tablet by mouth at bedtime every other day 5)  Trazodone Hcl 50 Mg Tabs (Trazodone hcl) .... Take 1 by mouth every other day 6)  Bilateral Carpal Tunnel Braces  .... Wear every night to bed 7)  Norvasc 5 Mg Tabs (Amlodipine besylate) .... Take 1 tablet by mouth once a day for blood pressure 8)  Tricor 145 Mg Tabs (Fenofibrate) .... Take 1 tablet by mouth once a day for cholesterol 9)  Naproxen 500 Mg Tabs (Naproxen) .... Take 1 tablet by mouth two times a day with food for 3 days, then two times a day with food as needed 10)  Flexeril 10 Mg Tabs (Cyclobenzaprine hcl) .... Take 1 tab by mouth at bedtime for muscle pain or spasm  Other Orders: Capillary Blood Glucose/CBG (69485) Hemoglobin A1C (46270)  Hypertension Assessment/Plan:      The patient's hypertensive risk group is category B: At least one risk factor (excluding diabetes) with no target organ damage.  Today's blood pressure is 110/82.  His blood pressure goal is < 140/90.  Patient Instructions: 1)  Apply heat for 15 minutes three times a day to your shoulder. 2)  Use tylenol as needed. 3)  Use the Naproxen just for 3 days.  Then, take it as needed.  Try to avoid using it too much because it can aggravate your stomach or make your BP go up. 4)  Use the flexeril at bedtime as needed .  It will make you sleepy. 5)  Please schedule a follow-up  appointment in 4 months with Scott for blood pressure and cholesterol.  Prescriptions: FLEXERIL 10 MG TABS (CYCLOBENZAPRINE HCL) Take 1 tab by mouth at bedtime for muscle pain or spasm  #30 x 0   Entered and Authorized by:   Tereso Newcomer PA-C   Signed by:   Tereso Newcomer PA-C on 11/16/2009   Method used:   Print then Give to Patient   RxID:   [id number] NAPROXEN 500 MG TABS (NAPROXEN) Take 1 tablet by mouth two times a day with food for 3  days, then two times a day with food as needed  #30 x 0   Entered and Authorized by:   Tereso Newcomer PA-C   Signed by:   Tereso Newcomer PA-C on 11/16/2009   Method used:   Print then Give to Patient   RxID:   [id number]   Laboratory Results   Blood Tests     HGBA1C: 5.3%   (Normal Range: Non-Diabetic - 3-6%   Control Diabetic - 6-8%)

## 2010-04-23 NOTE — Letter (Signed)
Summary: MAILED REQUESTED RECORDS TO [facility]  MAILED REQUESTED RECORDS TO [facility]   Imported By: Arta Bruce 12/14/2009 12:12:31  _____________________________________________________________________  External Attachment:    Type:   Image     Comment:   External Document

## 2010-04-23 NOTE — Progress Notes (Signed)
Summary: refill Norvasc  Phone Note Refill Request  on January 21, 2010 11:10 AM  Refills Requested: Medication #1:  NORVASC 5 MG TABS Take 1 tablet by mouth once a day for blood pressure   Dosage confirmed as above?Dosage Confirmed   Last Refilled: 12/17/2009 [facility]  Initial call taken by: Armenia Shannon,  January 18, 2010 4:48 PM  Follow-up for Phone Call        faxed to [facility] Gaylyn Cheers RN  January 21, 2010 11:15 AM     Prescriptions: NORVASC 5 MG TABS (AMLODIPINE BESYLATE) Take 1 tablet by mouth once a day for blood pressure  #30 x 3   Entered and Authorized by:   Gaylyn Cheers RN   Signed by:   Gaylyn Cheers RN on 01/21/2010   Method used:   Print then Give to Patient   RxID:   [id number]

## 2010-04-23 NOTE — Progress Notes (Signed)
   Phone Note Call from Patient   Summary of Call: spoke with Russell Adams and he said he feels better now since the Bp med has got into his body... Russell Adams says NO to fever, chills, nausea, vomiting, diarrhea, and stomach pains...Marland KitchenMarland Kitchen Russell Adams says he is not drinking as much as he was when he first came here... its only on the weekends when he drinks.... Russell Adams says he was fasting for labs he dd not eat anything after 5/6pm cause he went to bed at 8pm.... Russell Adams med list is correct to the list we have Initial call taken by: Armenia Shannon,  April 26, 2009 4:49 PM  Follow-up for Phone Call        see lab comments  Follow-up by: Tereso Newcomer PA-C,  April 26, 2009 5:39 PM
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
## 2010-04-23 NOTE — Letter (Signed)
Summary: MED/SOLUTIONS//APPROVED  MED/SOLUTIONS//APPROVED   Imported By: Arta Bruce 06/26/2009 14:46:03  _____________________________________________________________________  External Attachment:    Type:   Image     Comment:   External Document
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
## 2010-04-23 NOTE — Letter (Signed)
Summary: NUTRITION SUMMARY//SUSIE  NUTRITION SUMMARY//SUSIE   Imported By: Arta Bruce 08/23/2009 11:47:51  _____________________________________________________________________  External Attachment:    Type:   Image     Comment:   External Document

## 2010-04-23 NOTE — Progress Notes (Signed)
Summary: TRICOR NEEDS REFILLING  Phone Note Call from Patient Call back at Home Phone [phone]   Reason for Call: Refill Medication Summary of Call: Russell Adams PT. Russell Adams HAS BEEN TRYING TO GET HIS TRICOR REFILLED SINCE LAST WEEK. HE USES [facility]. @ [phone] Initial call taken by: Leodis Rains,  January 01, 2010 12:41 PM  Follow-up for Phone Call        Rx faxed to [facility] - pt. notified.  Dutch Quint RN  January 01, 2010 2:45 PM     Prescriptions: TRICOR 145 MG TABS (FENOFIBRATE) Take 1 tablet by mouth once a day for cholesterol  #30 x 3   Entered by:   Dutch Quint RN   Authorized by:   Tereso Newcomer PA-C   Signed by:   Dutch Quint RN on 01/01/2010   Method used:   Printed then faxed to ...       [facility] (retail)       [address] [place]       [address]       [place], [place]  [address]       Ph: [phone]       Fax: [phone]   RxID:   [id number]

## 2010-04-23 NOTE — Letter (Signed)
Summary: CALL A NURSE //CANCEL APPT  CALL A NURSE //CANCEL APPT   Imported By: Arta Bruce 08/15/2009 12:07:37  _____________________________________________________________________  External Attachment:    Type:   Image     Comment:   External Document

## 2010-05-24 ENCOUNTER — Telehealth (INDEPENDENT_AMBULATORY_CARE_PROVIDER_SITE_OTHER): Payer: Self-pay | Admitting: Nurse Practitioner

## 2010-06-03 LAB — RAPID URINE DRUG SCREEN, HOSP PERFORMED
Amphetamines: NOT DETECTED
Cocaine: NOT DETECTED
Opiates: NOT DETECTED
Tetrahydrocannabinol: NOT DETECTED

## 2010-06-03 LAB — COMPREHENSIVE METABOLIC PANEL
AST: 28 U/L (ref 0–37)
Albumin: 4.2 g/dL (ref 3.5–5.2)
BUN: 15 mg/dL (ref 6–23)
Creatinine, Ser: 1.05 mg/dL (ref 0.4–1.5)
GFR calc non Af Amer: >60 mL/min (ref >60)
Total Protein: 7.5 g/dL (ref 6.0–8.3)
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
## 2010-08-06 NOTE — Assessment & Plan Note (Signed)
[facility]                         GASTROENTEROLOGY OFFICE NOTE   INAKI, VANTINE                      MRN:          [id number]  DATE:06/17/2007                            DOB:          1970-08-31    REFERRING PHYSICIAN:  Turkey R. Rankins, M.D.   REFERRING PHYSICIAN:  Turkey R. Rankins, M.D.   REASON FOR CONSULTATION:  GERD and hematochezia.   HISTORY OF PRESENT ILLNESS:  Mr. Coate is a 40 year old white male  who relates acid reflux problems since age 64. He took Tagamet on a  regular basis at that time, and over the past 10 years or so he has been  maintained on daily Prilosec with very good control of his reflux  symptoms. He has rare episodes of breakthrough that are generally at  night. He notes mid sternal chest burning when his reflux symptoms are  active. He notes no dysphagia or odynophagia. He relates occasional  small amounts of bright red blood per rectum with bowel movements, and  these symptoms have occurred intermittently over the past several years.  He notes no change in bowel habits, change in stool caliber, diarrhea,  or constipation. He stopped smoking a couple years ago, and his weight  increased from 200 pounds to 240 pounds and with diet and exercise his  weight has now decreased to 187 pounds. Both his sister and mother have  had colon polyps.   PAST MEDICAL HISTORY:  1. Hypertension, diet-controlled.  2. Hyperlipidemia, diet-controlled.  3. Depression.  4. Anxiety.  5. Panic disorder.  6. Allergic rhinitis.  7. GERD.   PAST SURGICAL HISTORY:  Negative.   MEDICATION ALLERGIES:  VICODIN leading to a rash.   CURRENT MEDICATIONS:  Listed on the chart and reviewed.   SOCIAL HISTORY:  Per the handwritten form.   REVIEW OF SYSTEMS:  Per the handwritten form.   PHYSICAL EXAMINATION:  GENERAL:  Well-developed, well-nourished white  male in no acute distress. Height 6 feet; weight 187.2 pounds.  VITAL  SIGNS:  Blood pressure 124/74, pulse 72 and regular.  HEENT:  Anicteric sclerae. Oropharynx:  Clear.  CHEST:  Clear to auscultation bilaterally.  CARDIAC:  Regular rate and rhythm without murmurs appreciated.  ABDOMEN:  Soft, nontender, nondistended, normoactive bowel sounds. No  palpable organomegaly, masses, or hernias.  RECTAL:  Deferred to time of sigmoidoscopy.  EXTREMITIES:  Without clubbing, cyanosis, or edema.  NEUROLOGIC:  Alert and oriented x3. Grossly nonfocal.   ASSESSMENT AND PLAN:  1. Chronic gastroesophageal reflux disease. Rule out Barrett's      esophagus and erosive esophagitis. Risks, benefits, and      alternatives to upper endoscopy and possible biopsy discussed with      the patient, and he consents to proceed. This will be scheduled      electively. He was given standard information on anti-reflux      measures, maintain Prilosec 20 mg p.o. q.a.m. taken 30 minutes      before breakfast.  2. Intermittent small volume hematochezia and a family history of      colon polyps. We will plan to proceed  with flexible sigmoidoscopy      for further evaluation. Risks, benefits, and alternatives to      flexible sigmoidoscopy with possible biopsy and possible      destruction of internal hemorrhoids discussed with the patient, and      he consents to proceed. This will be scheduled electively.     Venita Lick. Russella Dar, MD, [facility]  Electronically Signed    MTS/MedQ  DD: 06/17/2007  DT: 06/17/2007  Job #: [id number]   cc:   Fanny Dance. Rankins, M.D.

## 2010-10-09 ENCOUNTER — Other Ambulatory Visit: Payer: Self-pay | Admitting: Internal Medicine

## 2010-10-09 DIAGNOSIS — R1011 Right upper quadrant pain: Secondary | ICD-10-CM

## 2010-10-15 ENCOUNTER — Other Ambulatory Visit (HOSPITAL_COMMUNITY): Payer: Self-pay

## 2011-06-17 ENCOUNTER — Other Ambulatory Visit (HOSPITAL_COMMUNITY): Payer: Self-pay | Admitting: Internal Medicine

## 2011-06-17 DIAGNOSIS — R1011 Right upper quadrant pain: Secondary | ICD-10-CM

## 2011-06-19 ENCOUNTER — Other Ambulatory Visit (HOSPITAL_COMMUNITY): Payer: Self-pay

## 2011-06-26 ENCOUNTER — Ambulatory Visit (HOSPITAL_COMMUNITY)
Admission: RE | Admit: 2011-06-26 | Discharge: 2011-06-26 | Disposition: A | Discharge status: Home / Self Care | Payer: Medicaid Other | Source: Ambulatory Visit | Attending: Internal Medicine | Admitting: Internal Medicine

## 2011-06-26 DIAGNOSIS — R1011 Right upper quadrant pain: Secondary | ICD-10-CM | POA: Insufficient documentation

## 2012-01-28 DIAGNOSIS — F17210 Nicotine dependence, cigarettes, uncomplicated: Secondary | ICD-10-CM | POA: Insufficient documentation

## 2012-01-28 DIAGNOSIS — M797 Fibromyalgia: Secondary | ICD-10-CM | POA: Insufficient documentation

## 2012-06-30 ENCOUNTER — Encounter: Payer: Self-pay | Admitting: Gastroenterology

## 2013-03-04 DIAGNOSIS — N400 Enlarged prostate without lower urinary tract symptoms: Secondary | ICD-10-CM | POA: Insufficient documentation

## 2013-09-19 ENCOUNTER — Emergency Department (HOSPITAL_COMMUNITY)
Admission: EM | Admit: 2013-09-19 | Discharge: 2013-09-19 | Disposition: A | Discharge status: Home / Self Care | Payer: Medicaid Other | Attending: Emergency Medicine | Admitting: Emergency Medicine

## 2013-09-19 ENCOUNTER — Encounter (HOSPITAL_COMMUNITY): Payer: Self-pay | Admitting: Emergency Medicine

## 2013-09-19 DIAGNOSIS — K089 Disorder of teeth and supporting structures, unspecified: Secondary | ICD-10-CM | POA: Insufficient documentation

## 2013-09-19 DIAGNOSIS — K219 Gastro-esophageal reflux disease without esophagitis: Secondary | ICD-10-CM | POA: Insufficient documentation

## 2013-09-19 DIAGNOSIS — K0889 Other specified disorders of teeth and supporting structures: Secondary | ICD-10-CM

## 2013-09-19 DIAGNOSIS — I10 Essential (primary) hypertension: Secondary | ICD-10-CM | POA: Insufficient documentation

## 2013-09-19 DIAGNOSIS — Z79899 Other long term (current) drug therapy: Secondary | ICD-10-CM | POA: Insufficient documentation

## 2013-09-19 DIAGNOSIS — F17200 Nicotine dependence, unspecified, uncomplicated: Secondary | ICD-10-CM | POA: Insufficient documentation

## 2013-09-19 DIAGNOSIS — F329 Major depressive disorder, single episode, unspecified: Secondary | ICD-10-CM | POA: Insufficient documentation

## 2013-09-19 DIAGNOSIS — E7800 Pure hypercholesterolemia, unspecified: Secondary | ICD-10-CM | POA: Insufficient documentation

## 2013-09-19 DIAGNOSIS — F3289 Other specified depressive episodes: Secondary | ICD-10-CM | POA: Insufficient documentation

## 2013-09-19 HISTORY — DX: Essential (primary) hypertension: I10

## 2013-09-19 HISTORY — DX: Depression, unspecified: F32.A

## 2013-09-19 HISTORY — DX: Major depressive disorder, single episode, unspecified: F32.9

## 2013-09-19 HISTORY — DX: Diverticulitis of intestine, part unspecified, without perforation or abscess without bleeding: K57.92

## 2013-09-19 HISTORY — DX: Gastro-esophageal reflux disease without esophagitis: K21.9

## 2013-09-19 HISTORY — DX: Pure hypercholesterolemia, unspecified: E78.00

## 2013-09-19 MED ORDER — PENICILLIN V POTASSIUM 500 MG PO TABS: 500.0000 mg | ORAL_TABLET | Freq: Four times a day (QID) | ORAL | Status: AC

## 2013-09-19 MED ORDER — TRAMADOL HCL 50 MG PO TABS: 50.0000 mg | ORAL_TABLET | Freq: Four times a day (QID) | ORAL | Status: DC | PRN

## 2013-09-19 NOTE — Discharge Instructions (Signed)
You have a dental injury. Use the resource guide listed below to help you find a dentist if you do not already have one to followup with. It is very important that you get evaluated by a dentist as soon as possible. Call tomorrow to schedule an appointment. Use your pain medication as prescribed and do not operate heavy machinery while on pain medication. Take your full course of antibiotics. Read the instructions below. ° °Eat a soft or liquid diet and rinse your mouth out after meals with warm water. You should see a dentist or return here at once if you have increased swelling, increased pain or uncontrolled bleeding from the site of your injury. ° ° °SEEK MEDICAL CARE IF:  °· You have increased pain not controlled with medicines.  °· You have swelling around your tooth, in your face or neck.  °· You have bleeding which starts, continues, or gets worse.  °· You have a fever >101 °· If you are unable to open your mouth ° °RESOURCE GUIDE ° °Dental Problems ° °Patients with Medicaid: °[facility]                     [facility] °[address]                                           [address] °Phone:  [phone]                                                  Phone:  510-2600 ° °If unable to pay or uninsured, contact:  [facility] or [facility] to become qualified for the adult dental clinic. ° °Chronic Pain Problems °Contact [facility]  [phone] °Patients need to be referred by their primary care doctor. ° °Insufficient Money for Medicine °Contact [employer]:  call "211" or [facility] 271-5999. ° °No Primary Care Doctor °Call [facility]  832-8000 °Other agencies that provide inexpensive medical care °   [facility]  832-8035 °   [facility]  832-7272 °   [facility]  271-5999 °   [facility]  832-4777 °   [facility]  373-0678 °   [facility]   [phone] ° °Psychological Services °[facility]  [phone] °[facility]  [phone] °[facility]   800 853-5163 (emergency services 641-4993) ° °Substance Abuse Resources °[facility]  336-882-2125 °[facility] 336-784-9470 °[facility] 336-285-9073 °[facility] 336-845-3988 °Residential & Outpatient Substance Abuse Program  800-659-3381 ° °Abuse/Neglect °[place] [facility] (336) 641-3795 °[place] [facility] Hot[facility] 800-378-5315 (After Hours) ° °Emergency Shelter °[facility] (336) 271-5985 ° °Maternity Homes °[facility] (336) 275-9566 °[facility] (704) 372-4663 ° °MRSA Hotline #:   832-7006 ° ° ° °[place] [facility] ° °[facility] [place] [facility]     [employer]                          [facility] °[address] [place]                         [address]      [address]  °[place]                                                [place]                            [place] °Phone:  349-3220                                   Phone:  342-7768                 Phone:  342-8140 ° °[facility] °Phone:  342-8316 ° °[place] [facility] Hotline °(336) 342-1394 °(336) 342-3537 (After Hours) ° ° ° ° °

## 2013-09-19 NOTE — ED Provider Notes (Signed)
CSN: [id number]     Arrival date & time 09/19/13  0908 History  This chart was scribed for a non-physician practitioner, Hyman Bible, working with Arbie Cookey, by Cathie Hoops, ED Scribe. The patient was seen in TR07C/TR07C. The patient's care was started at 9:23 AM.    Chief Complaint  Patient presents with  . Dental Pain    The history is provided by the patient. No language interpreter was used.   HPI Comments: Russell Adams is a 43 y.o. male who presents to the Emergency Department complaining of constant, gradually worsened right lower dental pain that began 4 days ago. Patient states the dental pain has progressed to a bilateral HA. Patient states he has used Sensodyne and Oragel with minimal relief. He has a regular dentist, but was unable to be seen due to lack of transportation. Patient denies fever, difficulty swallowing, neck stiffness, trismus, nausea, or vomiting.  Past Medical History  Diagnosis Date  . Hypertension   . Hypercholesterolemia   . GERD (gastroesophageal reflux disease)   . Diverticulitis   . Depression    Past Surgical History  Procedure Laterality Date  . Hernia repair     No family history on file. History  Substance Use Topics  . Smoking status: Current Every Day Smoker  . Smokeless tobacco: Not on file  . Alcohol Use: No    Review of Systems  Constitutional: Negative for fever.  HENT: Positive for dental problem. Negative for facial swelling and trouble swallowing.   Gastrointestinal: Negative for nausea and vomiting.  Musculoskeletal: Negative for neck pain and neck stiffness.  All other systems reviewed and are negative.     Allergies  Review of patient's allergies indicates no active allergies.  Home Medications   Prior to Admission medications   Medication Sig Start Date End Date Taking? Authorizing Provider  fenofibrate (TRICOR) 145 MG tablet Take 145 mg by mouth daily.   Yes Historical Provider, MD    lisinopril-hydrochlorothiazide (PRINZIDE,ZESTORETIC) 10-12.5 MG per tablet Take 1 tablet by mouth daily.   Yes Historical Provider, MD  omeprazole (PRILOSEC) 20 MG capsule Take 60 mg by mouth 2 (two) times daily.   Yes Historical Provider, MD  sertraline (ZOLOFT) 50 MG tablet Take 50 mg by mouth daily.   Yes Historical Provider, MD  zolpidem (AMBIEN) 10 MG tablet Take 15 mg by mouth at bedtime as needed for sleep.   Yes Historical Provider, MD   Triage Vitals: BP 142/88  Pulse 104  Temp(Src) 97 F (36.1 C) (Oral)  Resp 18  Ht 6' (1.829 m)  Wt 200 lb (90.719 kg)  BMI 27.12 kg/m2  SpO2 98% Physical Exam  Nursing note and vitals reviewed. Constitutional: He is oriented to person, place, and time. He appears well-developed and well-nourished.  HENT:  Head: Normocephalic and atraumatic.  Mouth/Throat: Oropharynx is clear and moist. No trismus in the jaw. No dental abscesses.    Tender to palpation over the right lower gingiva. No obvious dental abscess. No sublingual tenderness or swelling. No submandibular or submental swelling.   Eyes: Conjunctivae and EOM are normal.  Neck: Normal range of motion. Neck supple.  Cardiovascular: Normal rate, regular rhythm and normal heart sounds.   Pulmonary/Chest: Effort normal and breath sounds normal.  Abdominal: He exhibits no distension.  Neurological: He is alert and oriented to person, place, and time.  Skin: Skin is warm and dry.  Psychiatric: He has a normal mood and affect.    ED Course  Procedures (including critical care time)  DIAGNOSTIC STUDIES: Oxygen Saturation is 98% on room air, normal by my interpretation.    COORDINATION OF CARE: 9:27 AM- Will prescribe antibiotics. Patient advised to see regular dentist. Patient informed of current plan for treatment and evaluation and agrees with plan at this time.  Labs Review Labs Reviewed - No data to display  Imaging Review No results found.   EKG Interpretation None       MDM   Final diagnoses:  None   Patient with toothache.  No gross abscess.  Exam unconcerning for Ludwig's angina or spread of infection.  Will treat with penicillin and pain medicine.  Urged patient to follow-up with dentist.  Patient stable for discharge.  Return precautions given.  I personally performed the services described in this documentation, which was scribed in my presence. The recorded information has been reviewed and is accurate.    Hyman Bible, PA-C 09/19/13 1213

## 2013-09-21 NOTE — ED Provider Notes (Signed)
Medical screening examination/treatment/procedure(s) were performed by non-physician practitioner and as supervising physician I was immediately available for consultation/collaboration.   Houston Siren III, MD 09/21/13 1037

## 2013-10-15 ENCOUNTER — Encounter (HOSPITAL_COMMUNITY): Payer: Self-pay | Admitting: Emergency Medicine

## 2013-10-15 ENCOUNTER — Emergency Department (HOSPITAL_COMMUNITY)
Admission: EM | Admit: 2013-10-15 | Discharge: 2013-10-15 | Disposition: A | Discharge status: Home / Self Care | Payer: Medicaid Other | Attending: Emergency Medicine | Admitting: Emergency Medicine

## 2013-10-15 DIAGNOSIS — F3289 Other specified depressive episodes: Secondary | ICD-10-CM | POA: Diagnosis not present

## 2013-10-15 DIAGNOSIS — K029 Dental caries, unspecified: Secondary | ICD-10-CM | POA: Insufficient documentation

## 2013-10-15 DIAGNOSIS — K006 Disturbances in tooth eruption: Secondary | ICD-10-CM | POA: Insufficient documentation

## 2013-10-15 DIAGNOSIS — K219 Gastro-esophageal reflux disease without esophagitis: Secondary | ICD-10-CM | POA: Insufficient documentation

## 2013-10-15 DIAGNOSIS — K0889 Other specified disorders of teeth and supporting structures: Secondary | ICD-10-CM

## 2013-10-15 DIAGNOSIS — F329 Major depressive disorder, single episode, unspecified: Secondary | ICD-10-CM | POA: Diagnosis not present

## 2013-10-15 DIAGNOSIS — K089 Disorder of teeth and supporting structures, unspecified: Secondary | ICD-10-CM | POA: Insufficient documentation

## 2013-10-15 DIAGNOSIS — Z79899 Other long term (current) drug therapy: Secondary | ICD-10-CM | POA: Insufficient documentation

## 2013-10-15 DIAGNOSIS — I10 Essential (primary) hypertension: Secondary | ICD-10-CM | POA: Diagnosis not present

## 2013-10-15 DIAGNOSIS — E7800 Pure hypercholesterolemia, unspecified: Secondary | ICD-10-CM | POA: Diagnosis not present

## 2013-10-15 DIAGNOSIS — F17200 Nicotine dependence, unspecified, uncomplicated: Secondary | ICD-10-CM | POA: Insufficient documentation

## 2013-10-15 MED ORDER — LIDOCAINE VISCOUS 2 % MT SOLN
15.0000 mL | Freq: Once | OROMUCOSAL | Status: AC
  Administered 2013-10-15: 15 mL via OROMUCOSAL
  Filled 2013-10-15: qty 15

## 2013-10-15 MED ORDER — OXYCODONE-ACETAMINOPHEN 5-325 MG PO TABS: 1.0000 | ORAL_TABLET | Freq: Four times a day (QID) | ORAL | Status: DC | PRN

## 2013-10-15 MED ORDER — OXYCODONE-ACETAMINOPHEN 5-325 MG PO TABS
1.0000 | ORAL_TABLET | Freq: Once | ORAL | Status: AC
  Administered 2013-10-15: 1 via ORAL
  Filled 2013-10-15: qty 1

## 2013-10-15 NOTE — ED Notes (Signed)
Pt. Stated, I had 4 teeth pulled on Tuesday and I think I have a couple of dried sockets.

## 2013-10-15 NOTE — ED Provider Notes (Signed)
CSN: [id number]     Arrival date & time 10/15/13  1146 History   This chart was scribed for non-physician practitioner,Rosio Weiss, PA-C working with Sharyon Cable, MD, by Erling Conte, ED Scribe. This patient was seen in room TR05C/TR05C and the patient's care was started at 12:34 PM.    Chief Complaint  Patient presents with  . Dental Pain      The history is provided by the patient. No language interpreter was used.   HPI Comments: Russell Adams is a 43 y.o. male who presents to the Emergency Department complaining of moderate, constant right sided dental pain since Tuesday. Patient states that he had 4 teeth that were abscessed  pulled on Tuesday by Dr. Romilda Garret. Patient states he is worried that he had a couple of dried sockets. Patient states he was given Hydrocodone for the pain that he has been taking 1 every 4-6 hours with mild relief. He states that he is out of the prescribed Hydrocodone. He states he also has been rinsing with a prescribed mouth rinse and prescribed Amoxicillin. He reports that he has an appt with Dr. Romilda Garret on Monday. He denies any drainage from his gums, fever, chills, or difficulty swallowing.     Past Medical History  Diagnosis Date  . Hypertension   . Hypercholesterolemia   . GERD (gastroesophageal reflux disease)   . Diverticulitis   . Depression    Past Surgical History  Procedure Laterality Date  . Hernia repair     No family history on file. History  Substance Use Topics  . Smoking status: Current Every Day Smoker  . Smokeless tobacco: Not on file  . Alcohol Use: No    Review of Systems  Constitutional: Negative for fever and chills.  HENT: Positive for dental problem (right side). Negative for trouble swallowing.        No drainage from site where teeth were pulled  All other systems reviewed and are negative.     Allergies  Review of patient's allergies indicates no active allergies.  Home Medications   Prior to  Admission medications   Medication Sig Start Date End Date Taking? Authorizing Provider  fenofibrate (TRICOR) 145 MG tablet Take 145 mg by mouth daily.    Historical Provider, MD  lisinopril-hydrochlorothiazide (PRINZIDE,ZESTORETIC) 10-12.5 MG per tablet Take 1 tablet by mouth daily.    Historical Provider, MD  omeprazole (PRILOSEC) 20 MG capsule Take 60 mg by mouth 2 (two) times daily.    Historical Provider, MD  oxyCODONE-acetaminophen (PERCOCET/ROXICET) 5-325 MG per tablet Take 1-2 tablets by mouth every 6 (six) hours as needed for severe pain. May take 2 tablets PO q 6 hours for severe pain - Do not take with Tylenol as this tablet already contains tylenol 10/15/13   Chazz Philson L Mat Stuard, PA-C  sertraline (ZOLOFT) 50 MG tablet Take 50 mg by mouth daily.    Historical Provider, MD  traMADol (ULTRAM) 50 MG tablet Take 1 tablet (50 mg total) by mouth every 6 (six) hours as needed. 09/19/13   Heather Laisure, PA-C  zolpidem (AMBIEN) 10 MG tablet Take 15 mg by mouth at bedtime as needed for sleep.    Historical Provider, MD   BP 130/91  Pulse 88  Temp(Src) 98.6 F (37 C) (Oral)  Resp 18  Ht 6' (1.829 m)  Wt 200 lb (90.719 kg)  BMI 27.12 kg/m2  SpO2 100%  Physical Exam  Nursing note and vitals reviewed. Constitutional: He is oriented to person, place,  and time. He appears well-developed and well-nourished. No distress.  HENT:  Head: Normocephalic and atraumatic.  Right Ear: External ear normal.  Left Ear: External ear normal.  Nose: Nose normal.  Mouth/Throat: Uvula is midline and mucous membranes are normal. No trismus in the jaw. Abnormal dentition. Dental caries present. No uvula swelling.  Submental and sublingual spaces are soft  Well healing socket sites from extractions. No swelling or purulent drainage. No bleeding.   Eyes: Conjunctivae are normal.  Neck: Normal range of motion. Neck supple.  Cardiovascular: Normal rate, regular rhythm and normal heart sounds.    Pulmonary/Chest: Effort normal and breath sounds normal. No respiratory distress.  Lymphadenopathy:    He has no cervical adenopathy.  Neurological: He is alert and oriented to person, place, and time.  Skin: Skin is warm and dry. He is not diaphoretic.  Psychiatric: He has a normal mood and affect.    ED Course  Procedures (including critical care time) Medications  lidocaine (XYLOCAINE) 2 % viscous mouth solution 15 mL (15 mLs Mouth/Throat Given 10/15/13 1241)  oxyCODONE-acetaminophen (PERCOCET/ROXICET) 5-325 MG per tablet 1 tablet (1 tablet Oral Given 10/15/13 1244)     DIAGNOSTIC STUDIES: Oxygen Saturation is 100% on RA, normal by my interpretation.    COORDINATION OF CARE: 12:41 PM Will order Xylocaine solution and Percocet. Pt advised of plan for treatment and pt agrees.     Labs Review Labs Reviewed - No data to display  Imaging Review No results found.   EKG Interpretation None      MDM   Final diagnoses:  Pain, dental    Filed Vitals:   10/15/13 1158  BP: 130/91  Pulse: 88  Temp: 98.6 F (37 C)  Resp: 18   Afebrile, NAD, non-toxic appearing, AAOx4.  Patient with toothache s/p extraction.  No gross abscess.  Exam unconcerning for Ludwig's angina or spread of infection.  Advised continue taking antibiotic course prescribed by dentist. Advised to continue mouthwash as prescribed by dentist. Will provide limited refill on pain medication.  Urged patient to follow-up with dentist. Patient is stable at time of discharge    I personally performed the services described in this documentation, which was scribed in my presence. The recorded information has been reviewed and is accurate.       Harlow Mares, PA-C 10/15/13 1318

## 2013-10-15 NOTE — ED Provider Notes (Signed)
Medical screening examination/treatment/procedure(s) were performed by non-physician practitioner and as supervising physician I was immediately available for consultation/collaboration.   EKG Interpretation None        Sharyon Cable, MD 10/15/13 [phone]

## 2013-10-15 NOTE — Discharge Instructions (Signed)
Please keep your follow up appointment with the dentist on Monday. Please finish your Amoxil course prescribed to you by your dentist. Please take pain medication and/or muscle relaxants as prescribed and as needed for pain. Please do not drive on narcotic pain medication or on muscle relaxants. Please use your at home Naproxen to help with inflammation and swelling. Please read all discharge instructions and return precautions.    Dental Pain A tooth ache may be caused by cavities (tooth decay). Cavities expose the nerve of the tooth to air and hot or cold temperatures. It may come from an infection or abscess (also called a boil or furuncle) around your tooth. It is also often caused by dental caries (tooth decay). This causes the pain you are having. DIAGNOSIS  Your caregiver can diagnose this problem by exam. TREATMENT   If caused by an infection, it may be treated with medications which kill germs (antibiotics) and pain medications as prescribed by your caregiver. Take medications as directed.  Only take over-the-counter or prescription medicines for pain, discomfort, or fever as directed by your caregiver.  Whether the tooth ache today is caused by infection or dental disease, you should see your dentist as soon as possible for further care. SEEK MEDICAL CARE IF: The exam and treatment you received today has been provided on an emergency basis only. This is not a substitute for complete medical or dental care. If your problem worsens or new problems (symptoms) appear, and you are unable to meet with your dentist, call or return to this location. SEEK IMMEDIATE MEDICAL CARE IF:   You have a fever.  You develop redness and swelling of your face, jaw, or neck.  You are unable to open your mouth.  You have severe pain uncontrolled by pain medicine. MAKE SURE YOU:   Understand these instructions.  Will watch your condition.  Will get help right away if you are not doing well or get  worse. Document Released: 03/10/2005 Document Revised: 06/02/2011 Document Reviewed: 10/27/2007 [facility] Patient Information 2015 [place], [place]. This information is not intended to replace advice given to you by your health care provider. Make sure you discuss any questions you have with your health care provider.

## 2014-01-02 DIAGNOSIS — K2270 Barrett's esophagus without dysplasia: Secondary | ICD-10-CM | POA: Insufficient documentation

## 2014-03-03 ENCOUNTER — Encounter (HOSPITAL_COMMUNITY): Payer: Self-pay | Admitting: Nurse Practitioner

## 2014-03-03 ENCOUNTER — Emergency Department (HOSPITAL_COMMUNITY)
Admission: EM | Admit: 2014-03-03 | Discharge: 2014-03-03 | Disposition: A | Discharge status: Home / Self Care | Payer: Medicaid Other | Attending: Emergency Medicine | Admitting: Emergency Medicine

## 2014-03-03 ENCOUNTER — Emergency Department (HOSPITAL_COMMUNITY): Payer: Medicaid Other

## 2014-03-03 DIAGNOSIS — Z720 Tobacco use: Secondary | ICD-10-CM | POA: Diagnosis not present

## 2014-03-03 DIAGNOSIS — E780 Pure hypercholesterolemia: Secondary | ICD-10-CM | POA: Diagnosis not present

## 2014-03-03 DIAGNOSIS — K219 Gastro-esophageal reflux disease without esophagitis: Secondary | ICD-10-CM | POA: Insufficient documentation

## 2014-03-03 DIAGNOSIS — I10 Essential (primary) hypertension: Secondary | ICD-10-CM | POA: Insufficient documentation

## 2014-03-03 DIAGNOSIS — Z79899 Other long term (current) drug therapy: Secondary | ICD-10-CM | POA: Diagnosis not present

## 2014-03-03 DIAGNOSIS — F329 Major depressive disorder, single episode, unspecified: Secondary | ICD-10-CM | POA: Insufficient documentation

## 2014-03-03 DIAGNOSIS — R079 Chest pain, unspecified: Secondary | ICD-10-CM | POA: Diagnosis not present

## 2014-03-03 LAB — CBC
HCT: 40.7 % (ref 39.0–52.0)
Hemoglobin: 14.2 g/dL (ref 13.0–17.0)
MCH: 31.5 pg (ref 26.0–34.0)
MCHC: 34.9 g/dL (ref 30.0–36.0)
MCV: 90.2 fL (ref 78.0–100.0)
PLATELETS: 216 10*3/uL (ref 150–400)
RBC: 4.51 MIL/uL (ref 4.22–5.81)
RDW: 12.2 % (ref 11.5–15.5)
WBC: 5.3 10*3/uL (ref 4.0–10.5)

## 2014-03-03 LAB — BASIC METABOLIC PANEL
ANION GAP: 15 (ref 5–15)
BUN: 14 mg/dL (ref 6–23)
CALCIUM: 9.5 mg/dL (ref 8.4–10.5)
CO2: 23 meq/L (ref 19–32)
Chloride: 102 mEq/L (ref 96–112)
Creatinine, Ser: 0.9 mg/dL (ref 0.50–1.35)
Glucose, Bld: 124 mg/dL — ABNORMAL HIGH (ref 70–99)
Potassium: 4.3 mEq/L (ref 3.7–5.3)
Sodium: 140 mEq/L (ref 137–147)

## 2014-03-03 LAB — I-STAT TROPONIN, ED: Troponin i, poc: 0 ng/mL (ref 0.00–0.08)

## 2014-03-03 MED ORDER — ALPRAZOLAM 0.5 MG PO TABS
0.5000 mg | ORAL_TABLET | Freq: Once | ORAL | Status: AC
  Administered 2014-03-03: 0.5 mg via ORAL
  Filled 2014-03-03: qty 1

## 2014-03-03 MED ORDER — ALPRAZOLAM 0.5 MG PO TABS: 0.5000 mg | ORAL_TABLET | Freq: Three times a day (TID) | ORAL | Status: DC | PRN

## 2014-03-03 NOTE — ED Notes (Signed)
Patient transported to X-ray 

## 2014-03-03 NOTE — ED Provider Notes (Signed)
CSN: [id number]     Arrival date & time 03/03/14  1511 History   First MD Initiated Contact with Patient 03/03/14 1531     Chief Complaint  Patient presents with  . Chest Pain     (Consider location/radiation/quality/duration/timing/severity/associated sxs/prior Treatment) Patient is a 43 y.o. male presenting with chest pain. The history is provided by the patient.  Chest Pain Pain location:  Substernal area Pain quality: pressure and radiating   Pain radiates to:  L shoulder Pain radiates to the back: no   Pain severity:  Mild Onset quality:  Gradual Duration:  10 days Timing:  Constant Progression:  Unchanged Chronicity:  New Context: stress (put his dog down of 15 years the day the pain started)   Relieved by:  Nothing Worsened by:  Nothing tried Associated symptoms: no abdominal pain, no cough, no fever, no nausea, no near-syncope, no shortness of breath and not vomiting     Past Medical History  Diagnosis Date  . Hypertension   . Hypercholesterolemia   . GERD (gastroesophageal reflux disease)   . Diverticulitis   . Depression    Past Surgical History  Procedure Laterality Date  . Hernia repair     History reviewed. No pertinent family history. History  Substance Use Topics  . Smoking status: Current Every Day Smoker  . Smokeless tobacco: Not on file  . Alcohol Use: Yes    Review of Systems  Constitutional: Negative for fever and chills.  Respiratory: Negative for cough and shortness of breath.   Cardiovascular: Positive for chest pain. Negative for leg swelling and near-syncope.  Gastrointestinal: Negative for nausea, vomiting and abdominal pain.  All other systems reviewed and are negative.     Allergies  Review of patient's allergies indicates no known allergies.  Home Medications   Prior to Admission medications   Medication Sig Start Date End Date Taking? Authorizing Provider  fenofibrate (TRICOR) 145 MG tablet Take 145 mg by mouth daily.     Historical Provider, MD  lisinopril-hydrochlorothiazide (PRINZIDE,ZESTORETIC) 10-12.5 MG per tablet Take 1 tablet by mouth daily.    Historical Provider, MD  omeprazole (PRILOSEC) 20 MG capsule Take 60 mg by mouth 2 (two) times daily.    Historical Provider, MD  oxyCODONE-acetaminophen (PERCOCET/ROXICET) 5-325 MG per tablet Take 1-2 tablets by mouth every 6 (six) hours as needed for severe pain. May take 2 tablets PO q 6 hours for severe pain - Do not take with Tylenol as this tablet already contains tylenol 10/15/13   Jennifer L Piepenbrink, PA-C  sertraline (ZOLOFT) 50 MG tablet Take 50 mg by mouth daily.    Historical Provider, MD  traMADol (ULTRAM) 50 MG tablet Take 1 tablet (50 mg total) by mouth every 6 (six) hours as needed. 09/19/13   Heather Laisure, PA-C  zolpidem (AMBIEN) 10 MG tablet Take 15 mg by mouth at bedtime as needed for sleep.    Historical Provider, MD   There were no vitals taken for this visit. Physical Exam  Constitutional: He is oriented to person, place, and time. He appears well-developed and well-nourished. No distress.  HENT:  Head: Normocephalic and atraumatic.  Mouth/Throat: Oropharynx is clear and moist. No oropharyngeal exudate.  Eyes: EOM are normal. Pupils are equal, round, and reactive to light.  Neck: Normal range of motion. Neck supple.  Cardiovascular: Normal rate and regular rhythm.  Exam reveals no friction rub.   No murmur heard. Pulmonary/Chest: Effort normal and breath sounds normal. No respiratory distress. He has no  wheezes. He has no rales.  Abdominal: Soft. He exhibits no distension. There is no tenderness. There is no rebound.  Musculoskeletal: Normal range of motion. He exhibits no edema.  Neurological: He is alert and oriented to person, place, and time. He exhibits normal muscle tone.  Skin: No rash noted. He is not diaphoretic.  Nursing note and vitals reviewed.   ED Course  Procedures (including critical care time) Labs Review Labs  Reviewed  Angus, ED    Imaging Review No results found.   EKG Interpretation   Date/Time:  Friday March 03 2014 15:24:45 EST Ventricular Rate:  69 PR Interval:  144 QRS Duration: 86 QT Interval:  382 QTC Calculation: 409 R Axis:   75 Text Interpretation:  Normal sinus rhythm Normal ECG No previous ECGs  available Confirmed by Sevier ([id number]) on 03/03/2014 3:32:51  PM      MDM   Final diagnoses:  Chest pain    31M here with CP. Began 10 days ago after he had to put down a dog he had for 15 years. The dog was a service dog, acting as a PTSD dog for him. Patient here from [place] for evaluation.  Patient here with stable vitals, well appearing. Risk factors of HTN and Family history - father died of an MI at age 89. CP pressure like, also has "electrical" type component, radiates to the left shoulder. No SOB, N/V, diaphoresis.  Patient's exam benign. Labs normal. EKG here normal. EKG from clinic reviewed - completely normal. With 10 days of pain and normal troponin and EKG, doubt ACS. Likely stress related. Patient does not need serial troponins.  CXR ok. Stable for discharge. Small Rx of Xanax given.   Evelina Bucy, MD 03/03/14 [phone]

## 2014-03-03 NOTE — ED Notes (Signed)
Pt attempted to sign discharge, sign tablet is not working.

## 2014-03-03 NOTE — ED Notes (Signed)
He c/o chest pain that started last week while he was putting his 45 year old dog to sleep. He describes as "pressure." he denies SOB, nausea. He went to his doctor today and they did an ekg on him and "were worried about the way it looked." A&Ox4, resp e/u

## 2014-03-03 NOTE — Discharge Instructions (Signed)

## 2014-05-12 DIAGNOSIS — M47816 Spondylosis without myelopathy or radiculopathy, lumbar region: Secondary | ICD-10-CM | POA: Insufficient documentation

## 2014-05-16 ENCOUNTER — Encounter: Payer: Self-pay | Admitting: Gastroenterology

## 2014-05-26 ENCOUNTER — Ambulatory Visit (INDEPENDENT_AMBULATORY_CARE_PROVIDER_SITE_OTHER): Payer: Medicaid Other | Admitting: Neurology

## 2014-05-26 ENCOUNTER — Encounter: Payer: Self-pay | Admitting: Neurology

## 2014-05-26 VITALS — BP 121/74 | HR 86 | Ht 72.0 in | Wt 211.0 lb

## 2014-05-26 DIAGNOSIS — M545 Low back pain, unspecified: Secondary | ICD-10-CM | POA: Insufficient documentation

## 2014-05-26 DIAGNOSIS — R202 Paresthesia of skin: Secondary | ICD-10-CM | POA: Insufficient documentation

## 2014-05-26 NOTE — Progress Notes (Signed)
PATIENT: Russell Adams DOB: 1970-12-09  HISTORICAL  HORACIO WERTH is a 44 years old right-handed male, referred by his primary care physician Dr. Jonni Sanger, for evaluation of low back pain, bilateral lower extremity paresthesia.  He has a history of depression, PTSD, disabled because of his mood disorder, he drinks alcohol regularly, 6 12 ounce beers daily,  He complains of chronic low back pain, no shooting pain, I have reviewed MRI of lumbar spine February 2016, mild degenerative changes at multiple levels, no significant canal, foraminal stenosis.  He complains of distal toes, plantar feet paresthesia, numbness tingling, gradually getting worse, no gait difficulty, no bowel and bladder incontinence, recent mild bilateral fingertips paresthesia.   REVIEW OF SYSTEMS: Full 14 system review of systems performed and notable only for fatigue, cough, wheezing, snoring, blood in stool, diarrhea, easy bruising, feeling hot, increased thirst, joints pain, cramps, achy muscles, allergy, runny nose, numbness, weakness, insomnia, snoring, depression, anxiety, decreased energy, disinterested in activities, racing thoughts.  ALLERGIES: Allergies  Allergen Reactions  . Vitamin B12 Anxiety and Shortness Of Breath    Pills  . Metaxalone Anxiety    HOME MEDICATIONS: Current Outpatient Prescriptions  Medication Sig Dispense Refill  . clonazePAM (KLONOPIN) 1 MG tablet Take 1 mg by mouth.    . fenofibrate (TRICOR) 145 MG tablet Take 145 mg by mouth.    Marland Kitchen lisinopril-hydrochlorothiazide (PRINZIDE,ZESTORETIC) 10-12.5 MG per tablet Take 1 tablet by mouth.    . methocarbamol (ROBAXIN) 500 MG tablet Take 500 mg by mouth.    . omega-3 acid ethyl esters (LOVAZA) 1 G capsule Take 1 g by mouth.    Marland Kitchen omeprazole (PRILOSEC) 20 MG capsule Take 20 mg by mouth.    Marland Kitchen acetaminophen (TYLENOL) 500 MG tablet Take 1,000 mg by mouth every 6 (six) hours as needed for mild pain.    . cetirizine (ZYRTEC) 10 MG tablet Take  10 mg by mouth.    . gabapentin (NEURONTIN) 300 MG capsule Take 300 mg by mouth 4 (four) times daily.    . sertraline (ZOLOFT) 50 MG tablet Take 50 mg by mouth.    . traMADol (ULTRAM) 50 MG tablet Take 1 tablet (50 mg total) by mouth every 6 (six) hours as needed. (Patient not taking: Reported on 03/03/2014) 15 tablet 0  . zolpidem (AMBIEN) 5 MG tablet Take 10 mg by mouth.     No current facility-administered medications for this visit.    PAST MEDICAL HISTORY: Past Medical History  Diagnosis Date  . Hypertension   . Hypercholesterolemia   . GERD (gastroesophageal reflux disease)   . Diverticulitis   . Depression   . Chronic back pain   . Barrett esophagus   . BPH (benign prostatic hyperplasia)   . IBS (irritable bowel syndrome)   . Hyperglyceridemia   . Fibromyalgia   . PTSD (post-traumatic stress disorder)   . Insomnia   . Allergic rhinitis   . Tobacco use   . Alcohol dependence   . Arthritis   . Neuropathy     PAST SURGICAL HISTORY: Past Surgical History  Procedure Laterality Date  . Hernia repair      FAMILY HISTORY: Family History  Problem Relation Age of Onset  . Heart attack Father   . Diabetes Father   . Alcoholism Father   . Drug abuse Father   . Hypertension Mother   . Diabetes Sister   . Diabetes Sister     SOCIAL HISTORY:  History   Social History  .  Marital Status: Single    Spouse Name: N/A  . Number of Children: 0  . Years of Education: Some colle   Occupational History  . Disabled    Social History Main Topics  . Smoking status: Current Every Day Smoker -- 0.50 packs/day    Types: Cigarettes  . Smokeless tobacco: Not on file  . Alcohol Use: 0.0 oz/week    0 Standard drinks or equivalent per week     Comment: Six pack beer/day  . Drug Use: No  . Sexual Activity: Not on file   Other Topics Concern  . Not on file   Social History Narrative   Lives with his sister.   Right-handed.   5 cups caffeine/day.     PHYSICAL EXAM    Filed Vitals:   05/26/14 0937  BP: 121/74  Pulse: 86  Height: 6' (1.829 m)  Weight: 211 lb (95.709 kg)    Not recorded      Body mass index is 28.61 kg/(m^2).  PHYSICAL EXAMNIATION:  Gen: NAD, conversant, well nourised, obese, well groomed                     Cardiovascular: Regular rate rhythm, no peripheral edema, warm, nontender. Eyes: Conjunctivae clear without exudates or hemorrhage Neck: Supple, no carotid bruise. Pulmonary: Clear to auscultation bilaterally   NEUROLOGICAL EXAM:  MENTAL STATUS: Speech:    Speech is normal; fluent and spontaneous with normal comprehension.  Cognition:    The patient is oriented to person, place, and time;     recent and remote memory intact;     language fluent;     normal attention, concentration,     fund of knowledge.  CRANIAL NERVES: CN II: Visual fields are full to confrontation. Fundoscopic exam is normal with sharp discs and no vascular changes. Venous pulsations are present bilaterally. Pupils are 4 mm and briskly reactive to light. Visual acuity is 20/20 bilaterally. CN III, IV, VI: extraocular movement are normal. No ptosis. CN V: Facial sensation is intact to pinprick in all 3 divisions bilaterally. Corneal responses are intact.  CN VII: Face is symmetric with normal eye closure and smile. CN VIII: Hearing is normal to rubbing fingers CN IX, X: Palate elevates symmetrically. Phonation is normal. CN XI: Head turning and shoulder shrug are intact CN XII: Tongue is midline with normal movements and no atrophy.  MOTOR: There is no pronator drift of out-stretched arms. Muscle bulk and tone are normal. Muscle strength is normal.   Shoulder abduction Shoulder external rotation Elbow flexion Elbow extension Wrist flexion Wrist extension Finger abduction Hip flexion Knee flexion Knee extension Ankle dorsi flexion Ankle plantar flexion  R 5 5 5 5 5 5 5 5 5 5 5 5   L 5 5 5 5 5 5 5 5 5 5 5 5     REFLEXES: Reflexes are 2+ and  symmetric at the biceps, triceps, knees, and ankles. Plantar responses are flexor.  SENSORY: Length dependent decreased to touch, pinprick  at ankle level, preserved toe position sense, and vibration sense are intact in fingers and toes.  COORDINATION: Rapid alternating movements and fine finger movements are intact. There is no dysmetria on finger-to-nose and heel-knee-shin. There are no abnormal or extraneous movements.   GAIT/STANCE: Posture is normal. Gait is steady with normal steps, base, arm swing, and turning. Heel and toe walking are normal. Tandem gait is normal.  Romberg is absent.   DIAGNOSTIC DATA (LABS, IMAGING, TESTING) - I reviewed  patient records, labs, notes, testing and imaging myself where available.  Lab Results  Component Value Date   WBC 5.3 03/03/2014   HGB 14.2 03/03/2014   HCT 40.7 03/03/2014   MCV 90.2 03/03/2014   PLT 216 03/03/2014      Component Value Date/Time   NA 140 03/03/2014 1542   K 4.3 03/03/2014 1542   CL 102 03/03/2014 1542   CO2 23 03/03/2014 1542   GLUCOSE 124* 03/03/2014 1542   BUN 14 03/03/2014 1542   CREATININE 0.90 03/03/2014 1542   CALCIUM 9.5 03/03/2014 1542   PROT 7.5 03/08/2010 1437   ALBUMIN 4.2 03/08/2010 1437   AST 28 03/08/2010 1437   ALT 29 03/08/2010 1437   ALKPHOS 65 03/08/2010 1437   BILITOT 0.8 03/08/2010 1437   GFRNONAA >90 03/03/2014 1542   GFRAA >90 03/03/2014 1542   Lab Results  Component Value Date   CHOL 182 11/16/2009   HDL 50 11/16/2009   LDLCALC 107* 11/16/2009   LDLDIRECT 100* 06/06/2009   TRIG 125 11/16/2009   CHOLHDL 3.6 Ratio 11/16/2009   Lab Results  Component Value Date   HGBA1C 5.3 11/16/2009   No results found for: [id number] Lab Results  Component Value Date   TSH 1.120 04/18/2009      ASSESSMENT AND PLAN  JAYLENE SCHROM is a 44 y.o. male with past medical history of alcohol use, presenting with bilateral feet paresthesia since 2014, progressively worse, low back pain, no  significant abnormality on MRI of lumbar spine, mild length dependent sensory changes,  Most consistent with mild peripheral neuropathy, Laboratory evaluation for etiology, EMG nerve conduction study  Marcial Pacas, M.D. Ph.D.  [facility] [address] [place], [place] [address] Ph: [phone] Fax: [phone]

## 2014-05-29 LAB — SEDIMENTATION RATE: Sed Rate: 2 mm/hr (ref 0–15)

## 2014-05-29 LAB — HEPATITIS PANEL, ACUTE
HEP B S AG: NEGATIVE
Hep A IgM: NEGATIVE
Hep B C IgM: NEGATIVE
Hep C Virus Ab: <0.1 s/co ratio (ref 0.0–0.9)

## 2014-05-29 LAB — PROTEIN ELECTROPHORESIS
A/G Ratio: 1.4 (ref 0.7–2.0)
ALBUMIN ELP: 4 g/dL (ref 3.2–5.6)
Alpha 1: 0.2 g/dL (ref 0.1–0.4)
Alpha 2: 0.8 g/dL (ref 0.4–1.2)
Beta: 1.3 g/dL (ref 0.6–1.3)
GLOBULIN, TOTAL: 2.9 g/dL (ref 2.0–4.5)
Gamma Globulin: 0.6 g/dL (ref 0.5–1.6)
TOTAL PROTEIN: 6.9 g/dL (ref 6.0–8.5)

## 2014-05-29 LAB — TSH: TSH: 0.838 u[IU]/mL (ref 0.450–4.500)

## 2014-05-29 LAB — RPR: RPR: NONREACTIVE

## 2014-05-29 LAB — ANA W/REFLEX IF POSITIVE: ANA: NEGATIVE

## 2014-05-29 LAB — FOLATE: Folate: 12.4 ng/mL (ref >3.0)

## 2014-05-29 LAB — B. BURGDORFI ANTIBODIES: Lyme IgG/IgM Ab: <0.91 {ISR} (ref 0.00–0.90)

## 2014-05-29 LAB — VITAMIN B12: VITAMIN B 12: 230 pg/mL (ref 211–946)

## 2014-05-29 LAB — HIV ANTIBODY (ROUTINE TESTING W REFLEX): HIV Screen 4th Generation wRfx: NONREACTIVE

## 2014-05-29 LAB — C-REACTIVE PROTEIN: CRP: 1.6 mg/L (ref 0.0–4.9)

## 2014-06-13 ENCOUNTER — Ambulatory Visit (INDEPENDENT_AMBULATORY_CARE_PROVIDER_SITE_OTHER): Payer: Medicaid Other | Admitting: Neurology

## 2014-06-13 ENCOUNTER — Ambulatory Visit (INDEPENDENT_AMBULATORY_CARE_PROVIDER_SITE_OTHER): Payer: Self-pay | Admitting: Neurology

## 2014-06-13 DIAGNOSIS — R202 Paresthesia of skin: Secondary | ICD-10-CM

## 2014-06-13 DIAGNOSIS — M545 Low back pain, unspecified: Secondary | ICD-10-CM

## 2014-06-13 DIAGNOSIS — Z0289 Encounter for other administrative examinations: Secondary | ICD-10-CM

## 2014-06-13 NOTE — Procedures (Signed)
   NCS (NERVE CONDUCTION STUDY) WITH EMG (ELECTROMYOGRAPHY) REPORT   STUDY DATE: June 13 2014 PATIENT NAME: Russell Adams DOB: 17-Sep-1970 MRN: [id number]    TECHNOLOGIST: Towana Badger ELECTROMYOGRAPHER: Marcial Pacas M.D.  CLINICAL INFORMATION:  44 year old male, with few years history of low back pain, bilateral feet paresthesia  FINDINGS: NERVE CONDUCTION STUDY: Bilateral peroneal, sural sensory responses were normal. Bilateral peroneal to EDB, tibial motor responses were normal. Bilateral tibial H reflexes were normal and symmetric.  Left median, ulnar sensory and motor responses were normal  NEEDLE ELECTROMYOGRAPHY:  Selected needle examination was performed at right lower extremity muscles, and right lumbosacral paraspinal muscles  Needle examination was normal in right tibialis anterior, tibialis posterior, peroneal longus, biceps femoris short head,  There was no spontaneous activity at right lumbosacral paraspinal muscles, right L4-5 S1  Conclusion:   This is a normal study. There was no electrodiagnostic evidence of large fiber peripheral neuropathy or right lumbosacral radiculopathy.  INTERPRETING PHYSICIAN:   Marcial Pacas M.D. Ph.D. [facility] [address], [place], [place] [address] [phone]

## 2014-06-13 NOTE — Progress Notes (Signed)
EMG nerve conduction study today is normal

## 2014-09-04 ENCOUNTER — Encounter: Payer: Self-pay | Admitting: Gastroenterology

## 2015-02-05 ENCOUNTER — Encounter (HOSPITAL_COMMUNITY): Payer: Self-pay | Admitting: *Deleted

## 2015-02-05 ENCOUNTER — Emergency Department (HOSPITAL_COMMUNITY)
Admission: EM | Admit: 2015-02-05 | Discharge: 2015-02-05 | Disposition: A | Discharge status: Home / Self Care | Payer: Medicaid Other | Attending: Emergency Medicine | Admitting: Emergency Medicine

## 2015-02-05 ENCOUNTER — Emergency Department (HOSPITAL_COMMUNITY): Payer: Medicaid Other

## 2015-02-05 DIAGNOSIS — R911 Solitary pulmonary nodule: Secondary | ICD-10-CM | POA: Diagnosis not present

## 2015-02-05 DIAGNOSIS — F4310 Post-traumatic stress disorder, unspecified: Secondary | ICD-10-CM | POA: Diagnosis not present

## 2015-02-05 DIAGNOSIS — Z79891 Long term (current) use of opiate analgesic: Secondary | ICD-10-CM | POA: Insufficient documentation

## 2015-02-05 DIAGNOSIS — M1990 Unspecified osteoarthritis, unspecified site: Secondary | ICD-10-CM | POA: Insufficient documentation

## 2015-02-05 DIAGNOSIS — I10 Essential (primary) hypertension: Secondary | ICD-10-CM | POA: Diagnosis not present

## 2015-02-05 DIAGNOSIS — Z79899 Other long term (current) drug therapy: Secondary | ICD-10-CM | POA: Diagnosis not present

## 2015-02-05 DIAGNOSIS — K219 Gastro-esophageal reflux disease without esophagitis: Secondary | ICD-10-CM | POA: Diagnosis not present

## 2015-02-05 DIAGNOSIS — Z792 Long term (current) use of antibiotics: Secondary | ICD-10-CM | POA: Diagnosis not present

## 2015-02-05 DIAGNOSIS — R1030 Lower abdominal pain, unspecified: Secondary | ICD-10-CM | POA: Diagnosis present

## 2015-02-05 DIAGNOSIS — F329 Major depressive disorder, single episode, unspecified: Secondary | ICD-10-CM | POA: Diagnosis not present

## 2015-02-05 DIAGNOSIS — G8929 Other chronic pain: Secondary | ICD-10-CM | POA: Insufficient documentation

## 2015-02-05 DIAGNOSIS — Z87438 Personal history of other diseases of male genital organs: Secondary | ICD-10-CM | POA: Diagnosis not present

## 2015-02-05 DIAGNOSIS — K2270 Barrett's esophagus without dysplasia: Secondary | ICD-10-CM | POA: Diagnosis not present

## 2015-02-05 DIAGNOSIS — F17200 Nicotine dependence, unspecified, uncomplicated: Secondary | ICD-10-CM | POA: Diagnosis not present

## 2015-02-05 DIAGNOSIS — Z8639 Personal history of other endocrine, nutritional and metabolic disease: Secondary | ICD-10-CM | POA: Insufficient documentation

## 2015-02-05 DIAGNOSIS — G4700 Insomnia, unspecified: Secondary | ICD-10-CM | POA: Insufficient documentation

## 2015-02-05 DIAGNOSIS — K5732 Diverticulitis of large intestine without perforation or abscess without bleeding: Secondary | ICD-10-CM | POA: Diagnosis not present

## 2015-02-05 LAB — CBC
HEMATOCRIT: 45.7 % (ref 39.0–52.0)
Hemoglobin: 15.1 g/dL (ref 13.0–17.0)
MCH: 29.3 pg (ref 26.0–34.0)
MCHC: 33 g/dL (ref 30.0–36.0)
MCV: 88.7 fL (ref 78.0–100.0)
Platelets: 262 10*3/uL (ref 150–400)
RBC: 5.15 MIL/uL (ref 4.22–5.81)
RDW: 13.5 % (ref 11.5–15.5)
WBC: 12.9 10*3/uL — ABNORMAL HIGH (ref 4.0–10.5)

## 2015-02-05 LAB — COMPREHENSIVE METABOLIC PANEL
ALBUMIN: 4 g/dL (ref 3.5–5.0)
ALT: 16 U/L — AB (ref 17–63)
AST: 17 U/L (ref 15–41)
Alkaline Phosphatase: 57 U/L (ref 38–126)
Anion gap: 12 (ref 5–15)
BILIRUBIN TOTAL: 1.2 mg/dL (ref 0.3–1.2)
BUN: 9 mg/dL (ref 6–20)
CHLORIDE: 100 mmol/L — AB (ref 101–111)
CO2: 24 mmol/L (ref 22–32)
CREATININE: 1.16 mg/dL (ref 0.61–1.24)
Calcium: 9.9 mg/dL (ref 8.9–10.3)
GFR calc Af Amer: >60 mL/min (ref >60)
GFR calc non Af Amer: >60 mL/min (ref >60)
GLUCOSE: 114 mg/dL — AB (ref 65–99)
POTASSIUM: 4 mmol/L (ref 3.5–5.1)
Sodium: 136 mmol/L (ref 135–145)
TOTAL PROTEIN: 8.7 g/dL — AB (ref 6.5–8.1)

## 2015-02-05 LAB — URINALYSIS, ROUTINE W REFLEX MICROSCOPIC
Glucose, UA: NEGATIVE mg/dL
HGB URINE DIPSTICK: NEGATIVE
Ketones, ur: NEGATIVE mg/dL
Nitrite: NEGATIVE
PH: 6 (ref 5.0–8.0)
Protein, ur: NEGATIVE mg/dL
SPECIFIC GRAVITY, URINE: 1.018 (ref 1.005–1.030)
Urobilinogen, UA: 1 mg/dL (ref 0.0–1.0)

## 2015-02-05 LAB — URINE MICROSCOPIC-ADD ON

## 2015-02-05 LAB — LIPASE, BLOOD: LIPASE: 23 U/L (ref 11–51)

## 2015-02-05 MED ORDER — OXYCODONE-ACETAMINOPHEN 5-325 MG PO TABS: 1.0000 | ORAL_TABLET | Freq: Once | ORAL | Status: DC

## 2015-02-05 MED ORDER — METRONIDAZOLE 500 MG PO TABS: 500.0000 mg | ORAL_TABLET | Freq: Two times a day (BID) | ORAL | Status: DC

## 2015-02-05 MED ORDER — CIPROFLOXACIN HCL 500 MG PO TABS
500.0000 mg | ORAL_TABLET | Freq: Once | ORAL | Status: AC
  Administered 2015-02-05: 500 mg via ORAL
  Filled 2015-02-05: qty 1

## 2015-02-05 MED ORDER — MORPHINE SULFATE (PF) 4 MG/ML IV SOLN
4.0000 mg | Freq: Once | INTRAVENOUS | Status: AC
  Administered 2015-02-05: 4 mg via INTRAVENOUS
  Filled 2015-02-05: qty 1

## 2015-02-05 MED ORDER — METRONIDAZOLE 500 MG PO TABS
500.0000 mg | ORAL_TABLET | Freq: Once | ORAL | Status: AC
  Administered 2015-02-05: 500 mg via ORAL
  Filled 2015-02-05: qty 1

## 2015-02-05 MED ORDER — ONDANSETRON HCL 4 MG/2ML IJ SOLN
4.0000 mg | Freq: Once | INTRAMUSCULAR | Status: AC
  Administered 2015-02-05: 4 mg via INTRAVENOUS
  Filled 2015-02-05: qty 2

## 2015-02-05 MED ORDER — OXYCODONE-ACETAMINOPHEN 5-325 MG PO TABS
1.0000 | ORAL_TABLET | Freq: Once | ORAL | Status: AC
  Administered 2015-02-05: 1 via ORAL
  Filled 2015-02-05: qty 1

## 2015-02-05 MED ORDER — CIPROFLOXACIN HCL 500 MG PO TABS: 500.0000 mg | ORAL_TABLET | Freq: Two times a day (BID) | ORAL | Status: DC

## 2015-02-05 MED ORDER — IOHEXOL 300 MG/ML  SOLN
100.0000 mL | Freq: Once | INTRAMUSCULAR | Status: AC | PRN
  Administered 2015-02-05: 100 mL via INTRAVENOUS

## 2015-02-05 NOTE — ED Notes (Signed)
Pt. Has been having lower abdominal pain since Friday. Pt has a hx of diverticulitis. Pt. States he has had problems with urination since yesterday, pt has had problems starting a stream. No pain with urination. Pt states he has also had some diarrhea.

## 2015-02-05 NOTE — Discharge Instructions (Signed)
You were found to have diverticulitis. He will be discharged with pain medication and antibiotics. You were also found to have a pulmonary nodule. Because you are a smoker, you need to have a repeat CT scan in one year.  Diverticulitis Diverticulitis is when small pockets that have formed in your colon (large intestine) become infected or swollen. HOME CARE  Follow your doctor's instructions.  Follow a special diet if told by your doctor.  When you feel better, your doctor may tell you to change your diet. You may be told to eat a lot of fiber. Fruits and vegetables are good sources of fiber. Fiber makes it easier to poop (have bowel movements).  Take supplements or probiotics as told by your doctor.  Only take medicines as told by your doctor.  Keep all follow-up visits with your doctor. GET HELP IF:  Your pain does not get better.  You have a hard time eating food.  You are not pooping like normal. GET HELP RIGHT AWAY IF:  Your pain gets worse.  Your problems do not get better.  Your problems suddenly get worse.  You have a fever.  You keep throwing up (vomiting).  You have bloody or black, tarry poop (stool). MAKE SURE YOU:   Understand these instructions.  Will watch your condition.  Will get help right away if you are not doing well or get worse.   This information is not intended to replace advice given to you by your health care provider. Make sure you discuss any questions you have with your health care provider.   Document Released: 08/27/2007 Document Revised: 03/15/2013 Document Reviewed: 02/02/2013 Elsevier Interactive Patient Education [employer].

## 2015-02-05 NOTE — ED Provider Notes (Signed)
CSN: [id number]   Arrival date & time 02/05/15 [id number]  History  By signing my name below, I, Altamease Oiler, attest that this documentation has been prepared under the direction and in the presence of Merryl Hacker, MD. Electronically Signed: Altamease Oiler, ED Scribe. 02/05/2015. 4:00 AM.  Chief Complaint  Patient presents with  . Abdominal Pain    HPI The history is provided by the patient. No language interpreter was used.   Russell Adams is a 44 y.o. male with history of IBS, diverticulitis, GERD, and Barret esophagus who presents to the Emergency Department complaining of gradually worsening lower abdominal pain (R>L) with onset 3 days ago. He describes the pain as pressure/sharp and rates it 8/10 in severity noting that this feels different than pain that he has had with diverticulitis.  Associated symptoms include difficulty starting urinary stream and non-bloody diarrhea. Pt denies fever, nausea, vomiting, dysuria, and aversion to food.   Past Medical History  Diagnosis Date  . Hypertension   . Hypercholesterolemia   . GERD (gastroesophageal reflux disease)   . Diverticulitis   . Depression   . Chronic back pain   . Barrett esophagus   . BPH (benign prostatic hyperplasia)   . IBS (irritable bowel syndrome)   . Hyperglyceridemia   . Fibromyalgia   . PTSD (post-traumatic stress disorder)   . Insomnia   . Allergic rhinitis   . Tobacco use   . Alcohol dependence ([place])   . Arthritis   . Neuropathy [facility])     Past Surgical History  Procedure Laterality Date  . Hernia repair      Family History  Problem Relation Age of Onset  . Heart attack Father   . Diabetes Father   . Alcoholism Father   . Drug abuse Father   . Hypertension Mother   . Diabetes Sister   . Diabetes Sister     Social History  Substance Use Topics  . Smoking status: Current Every Day Smoker -- 0.50 packs/day    Types: Cigarettes  . Smokeless tobacco: Never Used  . Alcohol Use: 0.0 oz/week     0 Standard drinks or equivalent per week     Comment: Six pack beer/day     Review of Systems  Constitutional: Negative for fever and chills.  Gastrointestinal: Positive for abdominal pain and diarrhea. Negative for nausea and vomiting.  Genitourinary: Positive for difficulty urinating. Negative for dysuria.  All other systems reviewed and are negative.  Home Medications   Prior to Admission medications   Medication Sig Start Date End Date Taking? Authorizing Provider  acetaminophen (TYLENOL) 500 MG tablet Take 1,000 mg by mouth every 6 (six) hours as needed for mild pain.    Historical Provider, MD  cetirizine (ZYRTEC) 10 MG tablet Take 10 mg by mouth.    Historical Provider, MD  ciprofloxacin (CIPRO) 500 MG tablet Take 1 tablet (500 mg total) by mouth 2 (two) times daily. 02/05/15   Merryl Hacker, MD  clonazePAM (KLONOPIN) 1 MG tablet Take 1 mg by mouth. 04/25/14   Historical Provider, MD  fenofibrate (TRICOR) 145 MG tablet Take 145 mg by mouth. 03/27/14   Historical Provider, MD  gabapentin (NEURONTIN) 300 MG capsule Take 300 mg by mouth 4 (four) times daily.    Historical Provider, MD  lisinopril-hydrochlorothiazide (PRINZIDE,ZESTORETIC) 10-12.5 MG per tablet Take 1 tablet by mouth. 01/09/14   Historical Provider, MD  methocarbamol (ROBAXIN) 500 MG tablet Take 500 mg by mouth. 05/12/14   Historical  Provider, MD  metroNIDAZOLE (FLAGYL) 500 MG tablet Take 1 tablet (500 mg total) by mouth 2 (two) times daily. 02/05/15   Merryl Hacker, MD  omega-3 acid ethyl esters (LOVAZA) 1 G capsule Take 1 g by mouth. 05/12/14   Historical Provider, MD  omeprazole (PRILOSEC) 20 MG capsule Take 20 mg by mouth. 09/13/13   Historical Provider, MD  oxyCODONE-acetaminophen (PERCOCET/ROXICET) 5-325 MG tablet Take 1 tablet by mouth once. 02/05/15   Merryl Hacker, MD  sertraline (ZOLOFT) 50 MG tablet Take 50 mg by mouth.    Historical Provider, MD  traMADol (ULTRAM) 50 MG tablet Take 1 tablet (50 mg  total) by mouth every 6 (six) hours as needed. Patient not taking: Reported on 03/03/2014 09/19/13   Hyman Bible, PA-C  zolpidem (AMBIEN) 5 MG tablet Take 10 mg by mouth.    Historical Provider, MD    Allergies  Vitamin b12 and Metaxalone  Triage Vitals: BP 154/101 mmHg  Pulse 129  Temp(Src) 98.4 F (36.9 C) (Oral)  Resp 17  Ht 6' (1.829 m)  Wt 200 lb (90.719 kg)  BMI 27.12 kg/m2  SpO2 99%  Physical Exam  Constitutional: He is oriented to person, place, and time. He appears well-developed and well-nourished. No distress.  HENT:  Head: Normocephalic and atraumatic.  Cardiovascular: Normal rate, regular rhythm and normal heart sounds.   No murmur heard. Pulmonary/Chest: Effort normal and breath sounds normal. No respiratory distress. He has no wheezes.  Abdominal: Soft. Bowel sounds are normal. There is tenderness. There is no rebound and no guarding.  Tenderness palpation mostly over the suprapubic and right lower quadrant. There is tenderness palpation left lower quadrant. Negative Rovsing's. No rebound or guarding  Musculoskeletal: He exhibits no edema.  Neurological: He is alert and oriented to person, place, and time.  Skin: Skin is warm and dry.  Psychiatric: He has a normal mood and affect.  Nursing note and vitals reviewed.   ED Course  Procedures   DIAGNOSTIC STUDIES: Oxygen Saturation is 99% on RA, normal by my interpretation.    COORDINATION OF CARE: 3:50 AM Discussed treatment plan which includes lab work, CT A/P, and pain management with pt at bedside and pt agreed to plan.  Labs Reviewed  COMPREHENSIVE METABOLIC PANEL - Abnormal; Notable for the following:    Chloride 100 (*)    Glucose, Bld 114 (*)    Total Protein 8.7 (*)    ALT 16 (*)    All other components within normal limits  CBC - Abnormal; Notable for the following:    WBC 12.9 (*)    All other components within normal limits  URINALYSIS, ROUTINE W REFLEX MICROSCOPIC (NOT AT [facility]) -  Abnormal; Notable for the following:    Color, Urine AMBER (*)    APPearance CLOUDY (*)    Bilirubin Urine SMALL (*)    Leukocytes, UA TRACE (*)    All other components within normal limits  LIPASE, BLOOD  URINE MICROSCOPIC-ADD ON    Imaging Review Ct Abdomen Pelvis W Contrast  02/05/2015  CLINICAL DATA:  Lower abdominal pain for 3 days. History of diverticulitis. EXAM: CT ABDOMEN AND PELVIS WITH CONTRAST TECHNIQUE: Multidetector CT imaging of the abdomen and pelvis was performed using the standard protocol following bolus administration of intravenous contrast. CONTRAST:  152mL OMNIPAQUE IOHEXOL 300 MG/ML  SOLN COMPARISON:  None. FINDINGS: Lower chest:  Subpleural 4 mm nodule in the right lower lobe. Liver: No focal lesion. Hepatobiliary: Gallbladder physiologically distended, question tiny  depending gallstone. No pericholecystic inflammatory change. No biliary dilatation. Pancreas: No ductal dilatation or surrounding inflammation. Spleen: Normal. Adrenal glands: No nodule. Kidneys: Symmetric renal enhancement and excretion. No hydronephrosis. No perinephric stranding or focal renal abnormality. Stomach/Bowel: Small hiatal hernia. Stomach decompressed. Small duodenum diverticulum. No dilated or thickened small bowel loops. There is diverticulosis of the distal colon. Colonic wall thickening with significant pericolonic fat stranding is seen about the mid sigmoid colon in the mid pelvis in a pattern consistent with diverticulitis, however the inflammation appears to span a moderate length segment of colon. There is no perforation or abscess. The appendix is normal. Vascular/Lymphatic: Small lower retroperitoneal lymph nodes, not enlarged right criteria Abdominal aorta is normal in caliber. Reproductive: Prostate gland normal in size. Bladder: Minimally distended, mild bladder wall thickening is likely reactive. No intravesicular air. Other: No pneumoperitoneum or abscess.  No ascites. Musculoskeletal:  There are no acute or suspicious osseous abnormalities. Os acetabuli on the left hip. Probable avascular necrosis of the left femoral head. Mild degenerative change in the spine. IMPRESSION: 1. Colonic wall thickening with pericolonic inflammatory change in the setting of multiple diverticula involving the mid sigmoid colon. Findings favor acute diverticulitis, though the inflammation appears to span a moderate length of the sigmoid colon. Recommend follow-up colonoscopy after course of treatment to exclude underlying neoplastic process. No perforation or abscess. 2. Urinary bladder wall thickening is likely reactive. 3. Incidental finding of avascular necrosis of the left femoral head. 4. Subpleural 4 mm nodule in the right lower lobe. If the patient is at high risk for bronchogenic carcinoma, follow-up chest CT at 1 year is recommended. If the patient is at low risk, no follow-up is needed. This recommendation follows the consensus statement: Guidelines for Management of Small Pulmonary Nodules Detected on CT Scans: A Statement from the [employer] as published in Radiology 2005; 237:395-400. Electronically Signed   By: Jeb Levering M.D.   On: 02/05/2015 06:14    I personally reviewed and evaluated these images and lab results as a part of my medical decision-making.    MDM   Final diagnoses:  Diverticulitis of large intestine without perforation or abscess without bleeding  Pulmonary nodule    Patient presents with abdominal pain, urinary symptoms, and diarrhea. Nontoxic on exam. Does have tenderness. Considerations include UTI, diverticulitis, appendicitis. Patient given pain and nausea medication.  He is afebrile. Urinalysis is reassuring. Lab work only notable for mild leukocytosis. CT scan of the abdomen and pelvis shows active diverticulitis. Patient is not vomiting. He was given oral Cipro and Flagyl. He was also given Percocet and able to tolerate this. Discussed with patient  outpatient management. He does need to follow-up with GI for colonoscopy. He is previously been seen by our GI. Of note, CT scan did show a incidental pulmonary nodule.  Patient does smoke half a pack per day. Discussed with patient that he would need follow-up in one year for repeat CT to reassess nodule. Patient stated understanding. He was given return precautions.  After history, exam, and medical workup I feel the patient has been appropriately medically screened and is safe for discharge home. Pertinent diagnoses were discussed with the patient. Patient was given return precautions.  I personally performed the services described in this documentation, which was scribed in my presence. The recorded information has been reviewed and is accurate.    Merryl Hacker, MD 02/06/15 [phone]

## 2015-02-27 ENCOUNTER — Emergency Department (HOSPITAL_COMMUNITY): Payer: Medicaid Other

## 2015-02-27 ENCOUNTER — Encounter (HOSPITAL_COMMUNITY): Payer: Self-pay | Admitting: *Deleted

## 2015-02-27 ENCOUNTER — Inpatient Hospital Stay (HOSPITAL_COMMUNITY)
Admission: EM | Admit: 2015-02-27 | Discharge: 2015-03-05 | DRG: 392 | Disposition: A | Discharge status: Home / Self Care | Payer: Medicaid Other | Attending: Internal Medicine | Admitting: Internal Medicine

## 2015-02-27 DIAGNOSIS — K589 Irritable bowel syndrome without diarrhea: Secondary | ICD-10-CM | POA: Diagnosis present

## 2015-02-27 DIAGNOSIS — F17210 Nicotine dependence, cigarettes, uncomplicated: Secondary | ICD-10-CM | POA: Diagnosis present

## 2015-02-27 DIAGNOSIS — F4310 Post-traumatic stress disorder, unspecified: Secondary | ICD-10-CM | POA: Diagnosis present

## 2015-02-27 DIAGNOSIS — M87052 Idiopathic aseptic necrosis of left femur: Secondary | ICD-10-CM | POA: Diagnosis present

## 2015-02-27 DIAGNOSIS — Z888 Allergy status to other drugs, medicaments and biological substances status: Secondary | ICD-10-CM

## 2015-02-27 DIAGNOSIS — E7800 Pure hypercholesterolemia, unspecified: Secondary | ICD-10-CM | POA: Diagnosis present

## 2015-02-27 DIAGNOSIS — G4700 Insomnia, unspecified: Secondary | ICD-10-CM | POA: Diagnosis present

## 2015-02-27 DIAGNOSIS — K5720 Diverticulitis of large intestine with perforation and abscess without bleeding: Secondary | ICD-10-CM | POA: Diagnosis present

## 2015-02-27 DIAGNOSIS — F1020 Alcohol dependence, uncomplicated: Secondary | ICD-10-CM | POA: Diagnosis present

## 2015-02-27 DIAGNOSIS — F329 Major depressive disorder, single episode, unspecified: Secondary | ICD-10-CM | POA: Diagnosis present

## 2015-02-27 DIAGNOSIS — R109 Unspecified abdominal pain: Secondary | ICD-10-CM | POA: Diagnosis not present

## 2015-02-27 DIAGNOSIS — G629 Polyneuropathy, unspecified: Secondary | ICD-10-CM | POA: Diagnosis present

## 2015-02-27 DIAGNOSIS — E869 Volume depletion, unspecified: Secondary | ICD-10-CM | POA: Diagnosis present

## 2015-02-27 DIAGNOSIS — F1010 Alcohol abuse, uncomplicated: Secondary | ICD-10-CM | POA: Diagnosis not present

## 2015-02-27 DIAGNOSIS — E876 Hypokalemia: Secondary | ICD-10-CM | POA: Diagnosis present

## 2015-02-27 DIAGNOSIS — M1990 Unspecified osteoarthritis, unspecified site: Secondary | ICD-10-CM | POA: Diagnosis present

## 2015-02-27 DIAGNOSIS — G8929 Other chronic pain: Secondary | ICD-10-CM | POA: Diagnosis present

## 2015-02-27 DIAGNOSIS — T502X5A Adverse effect of carbonic-anhydrase inhibitors, benzothiadiazides and other diuretics, initial encounter: Secondary | ICD-10-CM | POA: Diagnosis present

## 2015-02-27 DIAGNOSIS — I10 Essential (primary) hypertension: Secondary | ICD-10-CM | POA: Diagnosis present

## 2015-02-27 DIAGNOSIS — Z79899 Other long term (current) drug therapy: Secondary | ICD-10-CM | POA: Diagnosis not present

## 2015-02-27 DIAGNOSIS — K219 Gastro-esophageal reflux disease without esophagitis: Secondary | ICD-10-CM | POA: Diagnosis present

## 2015-02-27 DIAGNOSIS — F419 Anxiety disorder, unspecified: Secondary | ICD-10-CM | POA: Diagnosis present

## 2015-02-27 DIAGNOSIS — E871 Hypo-osmolality and hyponatremia: Secondary | ICD-10-CM | POA: Diagnosis present

## 2015-02-27 DIAGNOSIS — K5792 Diverticulitis of intestine, part unspecified, without perforation or abscess without bleeding: Secondary | ICD-10-CM | POA: Diagnosis present

## 2015-02-27 DIAGNOSIS — M797 Fibromyalgia: Secondary | ICD-10-CM | POA: Diagnosis present

## 2015-02-27 DIAGNOSIS — E781 Pure hyperglyceridemia: Secondary | ICD-10-CM | POA: Diagnosis present

## 2015-02-27 LAB — URINALYSIS, ROUTINE W REFLEX MICROSCOPIC
Bilirubin Urine: NEGATIVE
Glucose, UA: NEGATIVE mg/dL
HGB URINE DIPSTICK: NEGATIVE
Ketones, ur: NEGATIVE mg/dL
LEUKOCYTES UA: NEGATIVE
NITRITE: NEGATIVE
PROTEIN: NEGATIVE mg/dL
Specific Gravity, Urine: 1.004 — ABNORMAL LOW (ref 1.005–1.030)
pH: 5.5 (ref 5.0–8.0)

## 2015-02-27 LAB — COMPREHENSIVE METABOLIC PANEL
ALK PHOS: 57 U/L (ref 38–126)
ALT: 16 U/L — AB (ref 17–63)
ANION GAP: 11 (ref 5–15)
AST: 16 U/L (ref 15–41)
Albumin: 3.6 g/dL (ref 3.5–5.0)
BUN: 12 mg/dL (ref 6–20)
CALCIUM: 9.5 mg/dL (ref 8.9–10.3)
CO2: 25 mmol/L (ref 22–32)
CREATININE: 1.14 mg/dL (ref 0.61–1.24)
Chloride: 96 mmol/L — ABNORMAL LOW (ref 101–111)
Glucose, Bld: 114 mg/dL — ABNORMAL HIGH (ref 65–99)
Potassium: 3.4 mmol/L — ABNORMAL LOW (ref 3.5–5.1)
Sodium: 132 mmol/L — ABNORMAL LOW (ref 135–145)
TOTAL PROTEIN: 7.6 g/dL (ref 6.5–8.1)
Total Bilirubin: 0.6 mg/dL (ref 0.3–1.2)

## 2015-02-27 LAB — CBC
HCT: 40.4 % (ref 39.0–52.0)
HEMOGLOBIN: 13.2 g/dL (ref 13.0–17.0)
MCH: 28.5 pg (ref 26.0–34.0)
MCHC: 32.7 g/dL (ref 30.0–36.0)
MCV: 87.3 fL (ref 78.0–100.0)
PLATELETS: 291 10*3/uL (ref 150–400)
RBC: 4.63 MIL/uL (ref 4.22–5.81)
RDW: 13.6 % (ref 11.5–15.5)
WBC: 12.1 10*3/uL — AB (ref 4.0–10.5)

## 2015-02-27 LAB — LIPASE, BLOOD: Lipase: 25 U/L (ref 11–51)

## 2015-02-27 LAB — I-STAT CG4 LACTIC ACID, ED: LACTIC ACID, VENOUS: 0.52 mmol/L (ref 0.5–2.0)

## 2015-02-27 MED ORDER — LORAZEPAM 2 MG/ML IJ SOLN: 1.0000 mg | Freq: Four times a day (QID) | INTRAMUSCULAR | Status: AC | PRN

## 2015-02-27 MED ORDER — VITAMIN B-1 100 MG PO TABS
100.0000 mg | ORAL_TABLET | Freq: Every day | ORAL | Status: DC
  Administered 2015-02-28 – 2015-03-05 (×6): 100 mg via ORAL
  Filled 2015-02-27 (×6): qty 1

## 2015-02-27 MED ORDER — OMEGA-3-ACID ETHYL ESTERS 1 G PO CAPS
1.0000 g | ORAL_CAPSULE | Freq: Every day | ORAL | Status: DC
  Administered 2015-02-28 – 2015-03-05 (×6): 1 g via ORAL
  Filled 2015-02-27 (×6): qty 1

## 2015-02-27 MED ORDER — MORPHINE SULFATE (PF) 4 MG/ML IV SOLN
4.0000 mg | Freq: Once | INTRAVENOUS | Status: AC
  Administered 2015-02-27: 4 mg via INTRAVENOUS
  Filled 2015-02-27: qty 1

## 2015-02-27 MED ORDER — METRONIDAZOLE IN NACL 5-0.79 MG/ML-% IV SOLN
500.0000 mg | Freq: Three times a day (TID) | INTRAVENOUS | Status: DC
  Administered 2015-02-28: 500 mg via INTRAVENOUS
  Filled 2015-02-27: qty 100

## 2015-02-27 MED ORDER — SODIUM CHLORIDE 0.9 % IV SOLN
INTRAVENOUS | Status: DC
  Administered 2015-02-27 – 2015-02-28 (×2): via INTRAVENOUS

## 2015-02-27 MED ORDER — OXYCODONE HCL 5 MG PO TABS
5.0000 mg | ORAL_TABLET | ORAL | Status: DC | PRN
  Administered 2015-02-27 – 2015-03-05 (×27): 5 mg via ORAL
  Filled 2015-02-27 (×30): qty 1

## 2015-02-27 MED ORDER — CIPROFLOXACIN IN D5W 400 MG/200ML IV SOLN: 400.0000 mg | Freq: Two times a day (BID) | INTRAVENOUS | Status: DC

## 2015-02-27 MED ORDER — LISINOPRIL 10 MG PO TABS
10.0000 mg | ORAL_TABLET | Freq: Every day | ORAL | Status: DC
  Administered 2015-02-28 – 2015-03-05 (×6): 10 mg via ORAL
  Filled 2015-02-27 (×6): qty 1

## 2015-02-27 MED ORDER — METRONIDAZOLE IN NACL 5-0.79 MG/ML-% IV SOLN: 500.0000 mg | Freq: Three times a day (TID) | INTRAVENOUS | Status: DC

## 2015-02-27 MED ORDER — CLONAZEPAM 1 MG PO TABS
1.0000 mg | ORAL_TABLET | Freq: Two times a day (BID) | ORAL | Status: DC | PRN
  Administered 2015-02-27 – 2015-03-04 (×5): 1 mg via ORAL
  Filled 2015-02-27 (×5): qty 1

## 2015-02-27 MED ORDER — SERTRALINE HCL 50 MG PO TABS
50.0000 mg | ORAL_TABLET | Freq: Every day | ORAL | Status: DC
  Administered 2015-02-28 – 2015-03-05 (×6): 50 mg via ORAL
  Filled 2015-02-27 (×6): qty 1

## 2015-02-27 MED ORDER — IOHEXOL 300 MG/ML  SOLN
100.0000 mL | Freq: Once | INTRAMUSCULAR | Status: AC | PRN
  Administered 2015-02-27: 100 mL via INTRAVENOUS

## 2015-02-27 MED ORDER — HYDROCHLOROTHIAZIDE 12.5 MG PO CAPS: 12.5000 mg | ORAL_CAPSULE | Freq: Every day | ORAL | Status: DC

## 2015-02-27 MED ORDER — METRONIDAZOLE IVPB CUSTOM
1.0000 g | Freq: Once | INTRAVENOUS | Status: AC
  Administered 2015-02-27: 1 g via INTRAVENOUS
  Filled 2015-02-27 (×2): qty 200

## 2015-02-27 MED ORDER — GABAPENTIN 300 MG PO CAPS
300.0000 mg | ORAL_CAPSULE | Freq: Four times a day (QID) | ORAL | Status: DC
  Administered 2015-02-27 – 2015-03-05 (×20): 300 mg via ORAL
  Filled 2015-02-27 (×5): qty 1
  Filled 2015-02-27: qty 3
  Filled 2015-02-27 (×2): qty 1
  Filled 2015-02-27: qty 3
  Filled 2015-02-27: qty 1
  Filled 2015-02-27: qty 3
  Filled 2015-02-27 (×2): qty 1
  Filled 2015-02-27: qty 3
  Filled 2015-02-27 (×4): qty 1

## 2015-02-27 MED ORDER — PANTOPRAZOLE SODIUM 40 MG PO TBEC
40.0000 mg | DELAYED_RELEASE_TABLET | Freq: Every day | ORAL | Status: DC
  Administered 2015-02-27 – 2015-03-05 (×7): 40 mg via ORAL
  Filled 2015-02-27 (×7): qty 1

## 2015-02-27 MED ORDER — ONDANSETRON HCL 4 MG/2ML IJ SOLN
4.0000 mg | Freq: Four times a day (QID) | INTRAMUSCULAR | Status: DC | PRN
  Administered 2015-02-27: 4 mg via INTRAVENOUS
  Filled 2015-02-27: qty 2

## 2015-02-27 MED ORDER — SODIUM CHLORIDE 0.9 % IV BOLUS (SEPSIS)
1000.0000 mL | Freq: Once | INTRAVENOUS | Status: AC
  Administered 2015-02-27: 1000 mL via INTRAVENOUS

## 2015-02-27 MED ORDER — ENOXAPARIN SODIUM 40 MG/0.4ML ~~LOC~~ SOLN
40.0000 mg | SUBCUTANEOUS | Status: DC
  Administered 2015-02-28: 40 mg via SUBCUTANEOUS
  Filled 2015-02-27: qty 0.4

## 2015-02-27 MED ORDER — HYDROMORPHONE HCL 1 MG/ML IJ SOLN
1.0000 mg | INTRAMUSCULAR | Status: AC | PRN
  Administered 2015-02-27 – 2015-02-28 (×2): 1 mg via INTRAVENOUS
  Filled 2015-02-27 (×3): qty 1

## 2015-02-27 MED ORDER — ONDANSETRON HCL 4 MG/2ML IJ SOLN
4.0000 mg | Freq: Once | INTRAMUSCULAR | Status: AC
  Administered 2015-02-27: 4 mg via INTRAVENOUS
  Filled 2015-02-27: qty 2

## 2015-02-27 MED ORDER — ONDANSETRON HCL 4 MG/2ML IJ SOLN: 4.0000 mg | Freq: Three times a day (TID) | INTRAMUSCULAR | Status: DC | PRN

## 2015-02-27 MED ORDER — LISINOPRIL-HYDROCHLOROTHIAZIDE 10-12.5 MG PO TABS: 1.0000 | ORAL_TABLET | Freq: Every day | ORAL | Status: DC

## 2015-02-27 MED ORDER — LORAZEPAM 1 MG PO TABS: 1.0000 mg | ORAL_TABLET | Freq: Four times a day (QID) | ORAL | Status: AC | PRN

## 2015-02-27 MED ORDER — FOLIC ACID 1 MG PO TABS
1.0000 mg | ORAL_TABLET | Freq: Every day | ORAL | Status: DC
  Administered 2015-02-28 – 2015-03-05 (×6): 1 mg via ORAL
  Filled 2015-02-27 (×6): qty 1

## 2015-02-27 MED ORDER — THIAMINE HCL 100 MG/ML IJ SOLN: 100.0000 mg | Freq: Every day | INTRAMUSCULAR | Status: DC

## 2015-02-27 MED ORDER — FENOFIBRATE 160 MG PO TABS
160.0000 mg | ORAL_TABLET | Freq: Every day | ORAL | Status: DC
  Administered 2015-02-28 – 2015-03-05 (×6): 160 mg via ORAL
  Filled 2015-02-27 (×6): qty 1

## 2015-02-27 MED ORDER — LORATADINE 10 MG PO TABS
10.0000 mg | ORAL_TABLET | Freq: Every day | ORAL | Status: DC
  Administered 2015-02-28 – 2015-03-05 (×6): 10 mg via ORAL
  Filled 2015-02-27 (×6): qty 1

## 2015-02-27 MED ORDER — CIPROFLOXACIN IN D5W 400 MG/200ML IV SOLN
400.0000 mg | Freq: Once | INTRAVENOUS | Status: AC
  Administered 2015-02-27: 400 mg via INTRAVENOUS
  Filled 2015-02-27: qty 200

## 2015-02-27 MED ORDER — ZOLPIDEM TARTRATE 5 MG PO TABS
10.0000 mg | ORAL_TABLET | Freq: Every evening | ORAL | Status: DC | PRN
  Administered 2015-03-02 – 2015-03-03 (×2): 10 mg via ORAL
  Filled 2015-02-27 (×3): qty 2

## 2015-02-27 MED ORDER — ACETAMINOPHEN 500 MG PO TABS: 1000.0000 mg | ORAL_TABLET | Freq: Four times a day (QID) | ORAL | Status: DC | PRN

## 2015-02-27 MED ORDER — ONDANSETRON HCL 4 MG PO TABS
4.0000 mg | ORAL_TABLET | Freq: Four times a day (QID) | ORAL | Status: DC | PRN
  Administered 2015-03-02: 4 mg via ORAL
  Filled 2015-02-27 (×2): qty 1

## 2015-02-27 MED ORDER — ADULT MULTIVITAMIN W/MINERALS CH
1.0000 | ORAL_TABLET | Freq: Every day | ORAL | Status: DC
  Administered 2015-02-28 – 2015-03-05 (×6): 1 via ORAL
  Filled 2015-02-27 (×6): qty 1

## 2015-02-27 NOTE — ED Notes (Signed)
Patient transported to CT 

## 2015-02-27 NOTE — ED Notes (Signed)
Pt c/o RLQ pain onset yesterday, pt reports hx of similar symptoms prior to 01/2015 with a dx of diverticulosis, pt denies v/d, pt c/o nausea, pt reports rectal pain, denies bloody or dark colored stools, pt denies CP, SOB, A&O x4

## 2015-02-27 NOTE — ED Provider Notes (Signed)
CSN: [id number]     Arrival date & time 02/27/15  1414 History   First MD Initiated Contact with Patient 02/27/15 1753     Chief Complaint  Patient presents with  . Abdominal Pain     (Consider location/radiation/quality/duration/timing/severity/associated sxs/prior Treatment) HPI   Patient is a 44 year old male with past medical history of hypertension, GERD, Barrett's esophagus, IBS, fibromyalgia and diverticulitis who presents to the ED with complaint of right lower quadrant pain, onset 2 days. Patient reports having intermittent sharp pain to his right lower quadrant that feels consistent with his prior episodes of diverticulitis, pain aggravated with coughing and urinating. Patient reports she was seen in the ED on 11/15 with similar pain, CT scan revealed diverticulitis and patient was discharged home with antibiotics. Patient states his pain resolved and he was feeling much better until his pain returned a few days ago. Endorses associated nausea and nonbloody diarrhea. Patient also reports having rectal pain that started yesterday. Denies fever, chills, difficulty breathing, chest pain, vomiting, hematuria, penile or testicular pain/swelling, blood in stool, rectal bleeding. Patient states he has been taking ibuprofen at home with mild relief of pain.  Past Medical History  Diagnosis Date  . Hypertension   . Hypercholesterolemia   . GERD (gastroesophageal reflux disease)   . Diverticulitis   . Depression   . Chronic back pain   . Barrett esophagus   . BPH (benign prostatic hyperplasia)   . IBS (irritable bowel syndrome)   . Hyperglyceridemia   . Fibromyalgia   . PTSD (post-traumatic stress disorder)   . Insomnia   . Allergic rhinitis   . Tobacco use   . Alcohol dependence ([place])   . Arthritis   . Neuropathy [facility])    Past Surgical History  Procedure Laterality Date  . Hernia repair     Family History  Problem Relation Age of Onset  . Heart attack Father   . Diabetes  Father   . Alcoholism Father   . Drug abuse Father   . Hypertension Mother   . Diabetes Sister   . Diabetes Sister    Social History  Substance Use Topics  . Smoking status: Current Every Day Smoker -- 0.50 packs/day    Types: Cigarettes  . Smokeless tobacco: Never Used  . Alcohol Use: 0.0 oz/week    0 Standard drinks or equivalent per week     Comment: Six pack beer/day    Review of Systems  Gastrointestinal: Positive for nausea, abdominal pain and rectal pain.  All other systems reviewed and are negative.     Allergies  Vitamin b12 and Metaxalone  Home Medications   Prior to Admission medications   Medication Sig Start Date End Date Taking? Authorizing Provider  acetaminophen (TYLENOL) 500 MG tablet Take 1,000 mg by mouth every 6 (six) hours as needed for mild pain.   Yes Historical Provider, MD  cetirizine (ZYRTEC) 10 MG tablet Take 10 mg by mouth daily.    Yes Historical Provider, MD  clonazePAM (KLONOPIN) 1 MG tablet Take 1 mg by mouth 2 (two) times daily as needed for anxiety.  04/25/14  Yes Historical Provider, MD  fenofibrate (TRICOR) 145 MG tablet Take 145 mg by mouth daily.  03/27/14  Yes Historical Provider, MD  gabapentin (NEURONTIN) 300 MG capsule Take 300 mg by mouth 4 (four) times daily.   Yes Historical Provider, MD  lisinopril-hydrochlorothiazide (PRINZIDE,ZESTORETIC) 10-12.5 MG per tablet Take 1 tablet by mouth daily.  01/09/14  Yes Historical Provider, MD  omega-3 acid ethyl esters (LOVAZA) 1 G capsule Take 1 g by mouth daily.  05/12/14  Yes Historical Provider, MD  omeprazole (PRILOSEC) 20 MG capsule Take 20 mg by mouth 2 (two) times daily before a meal.  09/13/13  Yes Historical Provider, MD  sertraline (ZOLOFT) 50 MG tablet Take 50 mg by mouth daily.    Yes Historical Provider, MD  zolpidem (AMBIEN) 5 MG tablet Take 10 mg by mouth at bedtime as needed for sleep.    Yes Historical Provider, MD  ciprofloxacin (CIPRO) 500 MG tablet Take 1 tablet (500 mg total) by  mouth 2 (two) times daily. 02/05/15   Merryl Hacker, MD   BP 130/96 mmHg  Pulse 83  Temp(Src) 97.6 F (36.4 C) (Oral)  Resp 19  Ht 6' (1.829 m)  Wt 92.987 kg  BMI 27.80 kg/m2  SpO2 93% Physical Exam  Constitutional: He is oriented to person, place, and time. He appears well-developed and well-nourished. No distress.  HENT:  Head: Normocephalic and atraumatic.  Mouth/Throat: Oropharynx is clear and moist. No oropharyngeal exudate.  Eyes: Conjunctivae and EOM are normal. Right eye exhibits no discharge. Left eye exhibits no discharge. No scleral icterus.  Neck: Normal range of motion. Neck supple.  Cardiovascular: Normal rate, regular rhythm, normal heart sounds and intact distal pulses.   Pulmonary/Chest: Effort normal and breath sounds normal. No respiratory distress. He has no wheezes. He has no rales. He exhibits no tenderness.  Abdominal: Soft. Bowel sounds are normal. He exhibits no distension and no mass. There is tenderness in the right lower quadrant. There is no rigidity, no rebound, no guarding and negative Murphy's sign.  Genitourinary: Rectum normal. Rectal exam shows no external hemorrhoid, no internal hemorrhoid, no fissure, no mass, no tenderness and anal tone normal.  Musculoskeletal: Normal range of motion. He exhibits no edema.  Lymphadenopathy:    He has no cervical adenopathy.  Neurological: He is alert and oriented to person, place, and time.  Skin: Skin is warm and dry. He is not diaphoretic.  Nursing note and vitals reviewed.   ED Course  Procedures (including critical care time) Labs Review Labs Reviewed  COMPREHENSIVE METABOLIC PANEL - Abnormal; Notable for the following:    Sodium 132 (*)    Potassium 3.4 (*)    Chloride 96 (*)    Glucose, Bld 114 (*)    ALT 16 (*)    All other components within normal limits  CBC - Abnormal; Notable for the following:    WBC 12.1 (*)    All other components within normal limits  URINALYSIS, ROUTINE W REFLEX  MICROSCOPIC (NOT AT [facility]) - Abnormal; Notable for the following:    Specific Gravity, Urine 1.004 (*)    All other components within normal limits  LIPASE, BLOOD  I-STAT CG4 LACTIC ACID, ED    Imaging Review Ct Abdomen Pelvis W Contrast  02/27/2015  CLINICAL DATA:  Right lower quadrant pain for several days. Nausea. Diarrhea. Diverticulitis. EXAM: CT ABDOMEN AND PELVIS WITH CONTRAST TECHNIQUE: Multidetector CT imaging of the abdomen and pelvis was performed using the standard protocol following bolus administration of intravenous contrast. CONTRAST:  136mL OMNIPAQUE IOHEXOL 300 MG/ML  SOLN COMPARISON:  02/05/2015 FINDINGS: Lower chest:  No acute findings. Hepatobiliary: No masses or other significant abnormality. Pancreas: No mass, inflammatory changes, or other significant abnormality. Spleen: Within normal limits in size and appearance. Adrenals/Urinary Tract: No masses identified. No evidence of hydronephrosis. Stomach/Bowel: Moderate to severe sigmoid diverticulitis shows mild worsening  since previous study. Tiny extraluminal air bubbles are now seen in the adjacent sigmoid mesocolon, consistent with microperforation. No evidence of free intraperitoneal air, abscess, or free fluid. No evidence of bowel obstruction. Vascular/Lymphatic: No pathologically enlarged lymph nodes. No evidence of abdominal aortic aneurysm. Reproductive: No mass or other significant abnormality. Other: None. Musculoskeletal:  No suspicious bone lesions identified. IMPRESSION: Mild worsening of moderate to severe sigmoid diverticulitis, with microperforation. No evidence of free intraperitoneal air or abscess. Electronically Signed   By: Earle Gell M.D.   On: 02/27/2015 19:56   I have personally reviewed and evaluated these images and lab results as part of my medical decision-making.  Filed Vitals:   02/27/15 1915 02/27/15 2030  BP: 134/89 130/96  Pulse:  83  Temp:    Resp:       MDM   Final diagnoses:   Diverticulitis of large intestine with perforation without bleeding    Patient presents with right lower quadrant pain and associated nausea. Reports similar pain in the past with episodes of diverticulitis. Patient was seen in the ED on 02/06/15, diagnosed with diverticulitis, given a dose of Cipro and Flagyl in the ED and discharged home with Flagyl. Patient reports his pain resolved initially until he started having pain a few days ago. VSS. Exam revealed tenderness to right lower quadrant, no peritoneal signs. Labs and urine unremarkable. Patient given IV fluids and pain meds in the ED. CT abdomen revealed mild worsening of moderate severe sigmoid diverticulitis with microperforation, no free air or abscess. Patient started on IV Cipro and Flagyl in the ED. Consulted hospitalist, Dr. Tamala Julian agrees to admission, orders placed for med-surg bed. Discussed results and plan for admission with pt.       Chesley Noon [place], [place] [id number] [id number]  Delora Fuel, MD [id number]

## 2015-02-28 ENCOUNTER — Encounter (HOSPITAL_COMMUNITY): Payer: Self-pay | Admitting: General Surgery

## 2015-02-28 DIAGNOSIS — E871 Hypo-osmolality and hyponatremia: Secondary | ICD-10-CM

## 2015-02-28 DIAGNOSIS — K219 Gastro-esophageal reflux disease without esophagitis: Secondary | ICD-10-CM

## 2015-02-28 DIAGNOSIS — E781 Pure hyperglyceridemia: Secondary | ICD-10-CM

## 2015-02-28 DIAGNOSIS — Z8739 Personal history of other diseases of the musculoskeletal system and connective tissue: Secondary | ICD-10-CM | POA: Insufficient documentation

## 2015-02-28 DIAGNOSIS — E876 Hypokalemia: Secondary | ICD-10-CM | POA: Diagnosis present

## 2015-02-28 DIAGNOSIS — K5720 Diverticulitis of large intestine with perforation and abscess without bleeding: Principal | ICD-10-CM

## 2015-02-28 DIAGNOSIS — I10 Essential (primary) hypertension: Secondary | ICD-10-CM

## 2015-02-28 DIAGNOSIS — K5700 Diverticulitis of small intestine with perforation and abscess without bleeding: Secondary | ICD-10-CM

## 2015-02-28 DIAGNOSIS — F1010 Alcohol abuse, uncomplicated: Secondary | ICD-10-CM

## 2015-02-28 DIAGNOSIS — M87052 Idiopathic aseptic necrosis of left femur: Secondary | ICD-10-CM | POA: Diagnosis present

## 2015-02-28 LAB — RAPID URINE DRUG SCREEN, HOSP PERFORMED
Amphetamines: NOT DETECTED
BENZODIAZEPINES: NOT DETECTED
Barbiturates: NOT DETECTED
COCAINE: NOT DETECTED
OPIATES: POSITIVE — AB
Tetrahydrocannabinol: POSITIVE — AB

## 2015-02-28 LAB — BASIC METABOLIC PANEL
ANION GAP: 10 (ref 5–15)
BUN: 6 mg/dL (ref 6–20)
CALCIUM: 8.9 mg/dL (ref 8.9–10.3)
CO2: 27 mmol/L (ref 22–32)
CREATININE: 0.93 mg/dL (ref 0.61–1.24)
Chloride: 96 mmol/L — ABNORMAL LOW (ref 101–111)
Glucose, Bld: 116 mg/dL — ABNORMAL HIGH (ref 65–99)
Potassium: 3.6 mmol/L (ref 3.5–5.1)
SODIUM: 133 mmol/L — AB (ref 135–145)

## 2015-02-28 LAB — CBC
HEMATOCRIT: 38.6 % — AB (ref 39.0–52.0)
Hemoglobin: 12.4 g/dL — ABNORMAL LOW (ref 13.0–17.0)
MCH: 28.4 pg (ref 26.0–34.0)
MCHC: 32.1 g/dL (ref 30.0–36.0)
MCV: 88.3 fL (ref 78.0–100.0)
Platelets: 243 10*3/uL (ref 150–400)
RBC: 4.37 MIL/uL (ref 4.22–5.81)
RDW: 13.7 % (ref 11.5–15.5)
WBC: 13.1 10*3/uL — AB (ref 4.0–10.5)

## 2015-02-28 MED ORDER — VANCOMYCIN HCL IN DEXTROSE 1-5 GM/200ML-% IV SOLN
1000.0000 mg | Freq: Three times a day (TID) | INTRAVENOUS | Status: DC
  Filled 2015-02-28 (×2): qty 200

## 2015-02-28 MED ORDER — NICOTINE 7 MG/24HR TD PT24
7.0000 mg | MEDICATED_PATCH | Freq: Every day | TRANSDERMAL | Status: DC
  Administered 2015-02-28 – 2015-03-05 (×6): 7 mg via TRANSDERMAL
  Filled 2015-02-28 (×7): qty 1

## 2015-02-28 MED ORDER — DEXTROSE 5 % IV SOLN
2.0000 g | Freq: Three times a day (TID) | INTRAVENOUS | Status: DC
  Administered 2015-02-28: 2 g via INTRAVENOUS
  Filled 2015-02-28 (×3): qty 2

## 2015-02-28 MED ORDER — PIPERACILLIN-TAZOBACTAM 3.375 G IVPB
3.3750 g | Freq: Three times a day (TID) | INTRAVENOUS | Status: DC
  Administered 2015-02-28 – 2015-03-04 (×14): 3.375 g via INTRAVENOUS
  Filled 2015-02-28 (×20): qty 50

## 2015-02-28 MED ORDER — ENOXAPARIN SODIUM 60 MG/0.6ML ~~LOC~~ SOLN
55.0000 mg | SUBCUTANEOUS | Status: DC
  Administered 2015-03-01: 55 mg via SUBCUTANEOUS
  Filled 2015-02-28: qty 0.6

## 2015-02-28 MED ORDER — SODIUM CHLORIDE 0.9 % IV SOLN
INTRAVENOUS | Status: DC
  Administered 2015-02-28 – 2015-03-05 (×7): via INTRAVENOUS
  Filled 2015-02-28 (×12): qty 1000

## 2015-02-28 MED ORDER — POTASSIUM CHLORIDE 20 MEQ PO PACK: 20.0000 meq | PACK | Freq: Two times a day (BID) | ORAL | Status: DC

## 2015-02-28 MED ORDER — VANCOMYCIN HCL IN DEXTROSE 1-5 GM/200ML-% IV SOLN: 1000.0000 mg | Freq: Once | INTRAVENOUS | Status: DC

## 2015-02-28 MED ORDER — PIPERACILLIN-TAZOBACTAM 3.375 G IVPB 30 MIN: 3.3750 g | Freq: Three times a day (TID) | INTRAVENOUS | Status: DC

## 2015-02-28 MED ORDER — POTASSIUM CHLORIDE 10 MEQ/100ML IV SOLN
10.0000 meq | INTRAVENOUS | Status: AC
  Administered 2015-02-28 (×2): 10 meq via INTRAVENOUS
  Filled 2015-02-28 (×2): qty 100

## 2015-02-28 MED ORDER — VANCOMYCIN HCL IN DEXTROSE 1-5 GM/200ML-% IV SOLN
1000.0000 mg | Freq: Once | INTRAVENOUS | Status: AC
  Administered 2015-02-28: 1000 mg via INTRAVENOUS
  Filled 2015-02-28: qty 200

## 2015-02-28 NOTE — Consult Note (Signed)
Russell Adams 1970/08/19  [id number].   Primary Care MD: Dr. Billey Chang Requesting MD: Dr. Shanon Brow Tat Chief Complaint/Reason for Consult: diverticulitis with microperforation HPI: This is a 44 yo white male who is on disability for a h/o HTN, anxiety, depression, PTSD, fibromyalgia, and chronic back pain.  He states at age 28 he underwent an EGD revealing Barrett's esophagus and a colonoscopy that was negative by Dr. Deatra Ina.  4 years ago the patient states he had his first episode of diverticulitis that was treated as an outpatient by a [facility] group in [place].  He has not had any issues since then until around just before Thanksgiving.  He developed diffuse lower abdominal pain and presented to [facility] where he was evaluated with a CT scan that revealed diverticulitis but no evidence of perforation.  He was given a course of Cipro and Flagyl.  The patient states he took this, but there is some concern by the admitting MD that the patient was noncompliant with this.  Nonetheless, the patient states he improved after taking this course of therapy.  He has done well over the last 3 weeks since then.  This past Sunday, the patient states he began getting nauseated again and having diffuse lower abdominal pain.  He did have a BM, but no diarrhea or blood in his stool.  He denies any fevers, but admits to chills.  His pain consistently worsened and he came back to the [facility] last night.  He was rescanned and found to have worsening moderate to severe diverticulitis with a small microperforation.  His WBC was 12K.  He was admitted and we have been consulted to evaluate the patient for further recommendations.   ROS : Please see HPI, otherwise negative, currently.  Family History  Problem Relation Age of Onset  . Heart attack Father   . Diabetes Father   . Alcoholism Father   . Drug abuse Father   . Hypertension Mother   . Diabetes Sister   . Diabetes Sister     Past Medical History  Diagnosis Date   . Hypertension   . Hypercholesterolemia   . GERD (gastroesophageal reflux disease)   . Diverticulitis   . Depression   . Chronic back pain   . Barrett esophagus   . BPH (benign prostatic hyperplasia)   . IBS (irritable bowel syndrome)   . Hyperglyceridemia   . Fibromyalgia   . PTSD (post-traumatic stress disorder)   . Insomnia   . Allergic rhinitis   . Tobacco use   . Alcohol dependence ([place])   . Arthritis   . Neuropathy [facility])     Past Surgical History  Procedure Laterality Date  . Hernia repair Right     inguinal    Social History:  reports that he has been smoking Cigarettes.  He has been smoking about 0.50 packs per day. He has never used smokeless tobacco. He reports that he drinks about 25.2 oz of alcohol per week. He reports that he uses illicit drugs (Marijuana).  Allergies:  Allergies  Allergen Reactions  . Vitamin B12 Anxiety and Shortness Of Breath    Pills  . Metaxalone Anxiety    Medications Prior to Admission  Medication Sig Dispense Refill  . acetaminophen (TYLENOL) 500 MG tablet Take 1,000 mg by mouth every 6 (six) hours as needed for mild pain.    . cetirizine (ZYRTEC) 10 MG tablet Take 10 mg by mouth daily.     . clonazePAM (KLONOPIN) 1 MG tablet Take  1 mg by mouth 2 (two) times daily as needed for anxiety.     . fenofibrate (TRICOR) 145 MG tablet Take 145 mg by mouth daily.     Marland Kitchen gabapentin (NEURONTIN) 300 MG capsule Take 300 mg by mouth 4 (four) times daily.    Marland Kitchen lisinopril-hydrochlorothiazide (PRINZIDE,ZESTORETIC) 10-12.5 MG per tablet Take 1 tablet by mouth daily.     Marland Kitchen omega-3 acid ethyl esters (LOVAZA) 1 G capsule Take 1 g by mouth daily.     Marland Kitchen omeprazole (PRILOSEC) 20 MG capsule Take 20 mg by mouth 2 (two) times daily before a meal.     . sertraline (ZOLOFT) 50 MG tablet Take 50 mg by mouth daily.     Marland Kitchen zolpidem (AMBIEN) 5 MG tablet Take 10 mg by mouth at bedtime as needed for sleep.     . ciprofloxacin (CIPRO) 500 MG tablet Take 1 tablet (500  mg total) by mouth 2 (two) times daily. 14 tablet 0    Blood pressure 134/79, pulse 88, temperature 98.3 F (36.8 C), temperature source Oral, resp. rate 18, height 6' (1.829 m), weight 92.987 kg (205 lb), SpO2 98 %. Physical Exam: General: pleasant, WD, WN, slightly obese white male who is laying in bed in NAD HEENT: head is normocephalic, atraumatic.  Sclera are noninjected.  PERRL.  Ears and nose without any masses or lesions.  Mouth is pink and moist Heart: regular, rate, and rhythm.  Normal s1,s2. No obvious murmurs, gallops, or rubs noted.  Palpable radial and pedal pulses bilaterally Lungs: CTAB, no wheezes, rhonchi, or rales noted.  Respiratory effort nonlabored Abd: soft, tender in RLQ, mild suprapubic tenderness, and tenderness in the LLQ.  Mild voluntary guarding in the right and LLQ, no rebounding or peritoneal signs, ND, +BS, no masses, hernias, or organomegaly MS: all 4 extremities are symmetrical with no cyanosis, clubbing, or edema. Skin: warm and dry with no masses, lesions, or rashes Psych: A&Ox3 with an appropriate affect.    Results for orders placed or performed during the hospital encounter of 02/27/15 (from the past 48 hour(s))  Lipase, blood     Status: None   Collection Time: 02/27/15  3:05 PM  Result Value Ref Range   Lipase 25 11 - 51 U/L  Comprehensive metabolic panel     Status: Abnormal   Collection Time: 02/27/15  3:05 PM  Result Value Ref Range   Sodium 132 (L) 135 - 145 mmol/L   Potassium 3.4 (L) 3.5 - 5.1 mmol/L   Chloride 96 (L) 101 - 111 mmol/L   CO2 25 22 - 32 mmol/L   Glucose, Bld 114 (H) 65 - 99 mg/dL   BUN 12 6 - 20 mg/dL   Creatinine, Ser 1.14 0.61 - 1.24 mg/dL   Calcium 9.5 8.9 - 10.3 mg/dL   Total Protein 7.6 6.5 - 8.1 g/dL   Albumin 3.6 3.5 - 5.0 g/dL   AST 16 15 - 41 U/L   ALT 16 (L) 17 - 63 U/L   Alkaline Phosphatase 57 38 - 126 U/L   Total Bilirubin 0.6 0.3 - 1.2 mg/dL   GFR calc non Af Amer >60 >60 mL/min   GFR calc Af Amer >60  >60 mL/min    Comment: (NOTE) The eGFR has been calculated using the CKD EPI equation. This calculation has not been validated in all clinical situations. eGFR's persistently <60 mL/min signify possible Chronic Kidney Disease.    Anion gap 11 5 - 15  CBC  Status: Abnormal   Collection Time: 02/27/15  3:05 PM  Result Value Ref Range   WBC 12.1 (H) 4.0 - 10.5 K/uL   RBC 4.63 4.22 - 5.81 MIL/uL   Hemoglobin 13.2 13.0 - 17.0 g/dL   HCT 40.4 39.0 - 52.0 %   MCV 87.3 78.0 - 100.0 fL   MCH 28.5 26.0 - 34.0 pg   MCHC 32.7 30.0 - 36.0 g/dL   RDW 13.6 11.5 - 15.5 %   Platelets 291 150 - 400 K/uL  Urinalysis, Routine w reflex microscopic (not at [facility])     Status: Abnormal   Collection Time: 02/27/15  3:28 PM  Result Value Ref Range   Color, Urine YELLOW YELLOW   APPearance CLEAR CLEAR   Specific Gravity, Urine 1.004 (L) 1.005 - 1.030   pH 5.5 5.0 - 8.0   Glucose, UA NEGATIVE NEGATIVE mg/dL   Hgb urine dipstick NEGATIVE NEGATIVE   Bilirubin Urine NEGATIVE NEGATIVE   Ketones, ur NEGATIVE NEGATIVE mg/dL   Protein, ur NEGATIVE NEGATIVE mg/dL   Nitrite NEGATIVE NEGATIVE   Leukocytes, UA NEGATIVE NEGATIVE    Comment: MICROSCOPIC NOT DONE ON URINES WITH NEGATIVE PROTEIN, BLOOD, LEUKOCYTES, NITRITE, OR GLUCOSE <1000 mg/dL.  I-Stat CG4 Lactic Acid, ED     Status: None   Collection Time: 02/27/15  8:53 PM  Result Value Ref Range   Lactic Acid, Venous 0.52 0.5 - 2.0 mmol/L  CBC     Status: Abnormal   Collection Time: 02/28/15  4:18 AM  Result Value Ref Range   WBC 13.1 (H) 4.0 - 10.5 K/uL   RBC 4.37 4.22 - 5.81 MIL/uL   Hemoglobin 12.4 (L) 13.0 - 17.0 g/dL   HCT 38.6 (L) 39.0 - 52.0 %   MCV 88.3 78.0 - 100.0 fL   MCH 28.4 26.0 - 34.0 pg   MCHC 32.1 30.0 - 36.0 g/dL   RDW 13.7 11.5 - 15.5 %   Platelets 243 150 - 400 K/uL  Basic metabolic panel     Status: Abnormal   Collection Time: 02/28/15  4:18 AM  Result Value Ref Range   Sodium 133 (L) 135 - 145 mmol/L   Potassium 3.6 3.5 -  5.1 mmol/L   Chloride 96 (L) 101 - 111 mmol/L   CO2 27 22 - 32 mmol/L   Glucose, Bld 116 (H) 65 - 99 mg/dL   BUN 6 6 - 20 mg/dL   Creatinine, Ser 0.93 0.61 - 1.24 mg/dL   Calcium 8.9 8.9 - 10.3 mg/dL   GFR calc non Af Amer >60 >60 mL/min   GFR calc Af Amer >60 >60 mL/min    Comment: (NOTE) The eGFR has been calculated using the CKD EPI equation. This calculation has not been validated in all clinical situations. eGFR's persistently <60 mL/min signify possible Chronic Kidney Disease.    Anion gap 10 5 - 15   Ct Abdomen Pelvis W Contrast  02/27/2015  CLINICAL DATA:  Right lower quadrant pain for several days. Nausea. Diarrhea. Diverticulitis. EXAM: CT ABDOMEN AND PELVIS WITH CONTRAST TECHNIQUE: Multidetector CT imaging of the abdomen and pelvis was performed using the standard protocol following bolus administration of intravenous contrast. CONTRAST:  153mL OMNIPAQUE IOHEXOL 300 MG/ML  SOLN COMPARISON:  02/05/2015 FINDINGS: Lower chest:  No acute findings. Hepatobiliary: No masses or other significant abnormality. Pancreas: No mass, inflammatory changes, or other significant abnormality. Spleen: Within normal limits in size and appearance. Adrenals/Urinary Tract: No masses identified. No evidence of hydronephrosis. Stomach/Bowel: Moderate to  severe sigmoid diverticulitis shows mild worsening since previous study. Tiny extraluminal air bubbles are now seen in the adjacent sigmoid mesocolon, consistent with microperforation. No evidence of free intraperitoneal air, abscess, or free fluid. No evidence of bowel obstruction. Vascular/Lymphatic: No pathologically enlarged lymph nodes. No evidence of abdominal aortic aneurysm. Reproductive: No mass or other significant abnormality. Other: None. Musculoskeletal:  No suspicious bone lesions identified. IMPRESSION: Mild worsening of moderate to severe sigmoid diverticulitis, with microperforation. No evidence of free intraperitoneal air or abscess.  Electronically Signed   By: Earle Gell M.D.   On: 02/27/2015 19:56       Assessment/Plan 1. Diverticulitis with microperforation -this is likely a persistent episode of diverticulitis from November that never completely resolved.  He now has a small microperforation, but no evidence of sepsis or overwhelming infection.  He is tender on exam, but overall no acute surgical abdomen. -we would recommend broad coverage with zosyn since he is not PCN allergic. -we would recommend NPO x ice chips for now for bowel rest. -I have reviewed his CT scan and he does have a lot of inflammatory changes in the sigmoid colon area.  Our goal is to treat him medically and see if we can avoid surgical intervention that would likely result in a colostomy.  Ideally, if we can get him better conservatively, he would need a colonoscopy in 6-8 weeks, +/- a discussion about a possible elective resection since this is technically his 3rd episode, but this would be determined based off of surgeon preference and how the patient does. -we will follow this patient with you.  Thank you for this consultation.  Gerrie Castiglia E 02/28/2015, 12:58 PM Pager: [phone]

## 2015-02-28 NOTE — Progress Notes (Signed)
ANTIBIOTIC CONSULT NOTE - INITIAL  Pharmacy Consult for [facility] and Tressie Ellis Indication: diverticulitis  Allergies  Allergen Reactions  . Vitamin B12 Anxiety and Shortness Of Breath    Pills  . Metaxalone Anxiety    Patient Measurements: Height: 6' (182.9 cm) Weight: 205 lb (92.987 kg) IBW/kg (Calculated) : 77.6  Vital Signs: Temp: 98.5 F (36.9 C) (12/06 2236) Temp Source: Oral (12/06 2236) BP: 142/88 mmHg (12/06 2236) Pulse Rate: 103 (12/06 2236)  Labs:  Recent Labs  02/27/15 1505 02/28/15 0418  WBC 12.1* 13.1*  HGB 13.2 12.4*  PLT 291 243  CREATININE 1.14  --    Estimated Creatinine Clearance: 88.9 mL/min (by C-G formula based on Cr of 1.14).  Medical History: Past Medical History  Diagnosis Date  . Hypertension   . Hypercholesterolemia   . GERD (gastroesophageal reflux disease)   . Diverticulitis   . Depression   . Chronic back pain   . Barrett esophagus   . BPH (benign prostatic hyperplasia)   . IBS (irritable bowel syndrome)   . Hyperglyceridemia   . Fibromyalgia   . PTSD (post-traumatic stress disorder)   . Insomnia   . Allergic rhinitis   . Tobacco use   . Alcohol dependence ([place])   . Arthritis   . Neuropathy ([facility])     Medications:  Prescriptions prior to admission  Medication Sig Dispense Refill Last Dose  . acetaminophen (TYLENOL) 500 MG tablet Take 1,000 mg by mouth every 6 (six) hours as needed for mild pain.   02/26/2015 at Unknown time  . cetirizine (ZYRTEC) 10 MG tablet Take 10 mg by mouth daily.    02/26/2015 at Unknown time  . clonazePAM (KLONOPIN) 1 MG tablet Take 1 mg by mouth 2 (two) times daily as needed for anxiety.    02/26/2015 at Unknown time  . fenofibrate (TRICOR) 145 MG tablet Take 145 mg by mouth daily.    02/27/2015 at Unknown time  . gabapentin (NEURONTIN) 300 MG capsule Take 300 mg by mouth 4 (four) times daily.   02/27/2015 at Unknown time  . lisinopril-hydrochlorothiazide (PRINZIDE,ZESTORETIC) 10-12.5 MG per tablet Take 1  tablet by mouth daily.    02/27/2015 at Unknown time  . omega-3 acid ethyl esters (LOVAZA) 1 G capsule Take 1 g by mouth daily.    02/27/2015 at Unknown time  . omeprazole (PRILOSEC) 20 MG capsule Take 20 mg by mouth 2 (two) times daily before a meal.    02/27/2015 at Unknown time  . sertraline (ZOLOFT) 50 MG tablet Take 50 mg by mouth daily.    02/27/2015 at Unknown time  . zolpidem (AMBIEN) 5 MG tablet Take 10 mg by mouth at bedtime as needed for sleep.    PRN  . ciprofloxacin (CIPRO) 500 MG tablet Take 1 tablet (500 mg total) by mouth 2 (two) times daily. 14 tablet 0    Scheduled:  . cefTAZidime (FORTAZ)  IV  2 g Intravenous 3 times per day  . enoxaparin (LOVENOX) injection  40 mg Subcutaneous Q24H  . fenofibrate  160 mg Oral Daily  . folic acid  1 mg Oral Daily  . gabapentin  300 mg Oral QID  . lisinopril  10 mg Oral Daily   And  . hydrochlorothiazide  12.5 mg Oral Daily  . loratadine  10 mg Oral Daily  . metronidazole  500 mg Intravenous Q8H  . multivitamin with minerals  1 tablet Oral Daily  . omega-3 acid ethyl esters  1 g Oral Daily  .  pantoprazole  40 mg Oral Daily  . potassium chloride  10 mEq Intravenous Q1 Hr x 2  . sertraline  50 mg Oral Daily  . thiamine  100 mg Oral Daily   Or  . thiamine  100 mg Intravenous Daily  . vancomycin  1,000 mg Intravenous Once   Infusions:  . sodium chloride 125 mL/hr at 02/27/15 2230    Assessment: 44yo male was tx'd for diverticulitis w/ PO ABX as outpt after visit to ED ~3wk ago, completed course of Cipro/Flagyl, now w/ similar sx but worsening labs, CT shows diverticulitis w/ microperforations, to broaden IV ABX coverage after starting on Cipro/Flagyl in ED.  Goal of Therapy:  Vancomycin trough level 15-20 mcg/ml  Plan:  Will begin vancomycin 1000mg  IV Q8H and Fortaz 2g IV Q8H and monitor CBC, Cx, levels prn.  Wynona Neat, PharmD, BCPS  02/28/2015,5:36 AM

## 2015-02-28 NOTE — Progress Notes (Signed)
PROGRESS NOTE  NATALIE CORR [id number] DOB: 1970/07/11 DOA: 02/27/2015 PCP: Leamon Arnt, MD  Brief History 44 year old male with a history of hypertension, hyperlipidemia, diverticulitis, depression, PTSD, tobacco and alcohol dependence presented with 2 day history of right lower quadrant abdominal pain that radiates to the left lower quadrant. He has had some nausea without any vomiting. He has had some loose stools without any hematochezia or melena. He does complain of some rectal pain and abdominal pain with bowel movements. The patient was treated with Cipro and Flagyl after an emergency department visit on 02/05/2015 for diverticulitis. CT of the abdomen and pelvis on 02/05/2015 revealed sigmoid diverticulitis. Repeat CT abdomen and pelvis 02/27/2015 showed mild worsening with microperforation. The patient was admitted for intravenous fluids and intravenous antibiotics. General surgery was consulted. Assessment/Plan: Acute diverticulitis -Patient claims that he took Cipro and Flagyl, but doubt that he was compliant as he drank alcohol at the end of the abx regimen with concomitant disulfiram effect from the patient's metronidazole -02/27/2015 CT abdomen and pelvis--moderate to severe sigmoid diverticulitis with mild worsening and microperforation -Consult general surgery as this is the patient's third episode in the past 3-4 years -We'll keep nothing by mouth for now until seen by surgery -Continue IV fluids -Continue opioids for pain control -Continue intravenous antibiotics Discontinue vancomycin- -Follow up blood cultures Avascular necrosis--left femoral head -Incidental finding on CT -Patient is asymptomatic  -Will need outpatient orthopedics follow-up  Hypertension  -Fair control  -Continue lisinopril  Hyponatremia  -Secondary to HCTZ and volume depletion  Dyslipidemia  -Continue fenofibrate and Lovaza  PTSD history  -Continue Zoloft  Alcohol dependence    -Drinks 6 pack beer per day  -CIWA protocol -last drink 02/26/15 GERD -continue PPI Tobacco abuse -Tobacco cessation discussed -NicoDerm patch   Family Communication:   Pt at beside Disposition Plan:   Home 2-3 days       Procedures/Studies: Ct Abdomen Pelvis W Contrast  02/27/2015  CLINICAL DATA:  Right lower quadrant pain for several days. Nausea. Diarrhea. Diverticulitis. EXAM: CT ABDOMEN AND PELVIS WITH CONTRAST TECHNIQUE: Multidetector CT imaging of the abdomen and pelvis was performed using the standard protocol following bolus administration of intravenous contrast. CONTRAST:  15mL OMNIPAQUE IOHEXOL 300 MG/ML  SOLN COMPARISON:  02/05/2015 FINDINGS: Lower chest:  No acute findings. Hepatobiliary: No masses or other significant abnormality. Pancreas: No mass, inflammatory changes, or other significant abnormality. Spleen: Within normal limits in size and appearance. Adrenals/Urinary Tract: No masses identified. No evidence of hydronephrosis. Stomach/Bowel: Moderate to severe sigmoid diverticulitis shows mild worsening since previous study. Tiny extraluminal air bubbles are now seen in the adjacent sigmoid mesocolon, consistent with microperforation. No evidence of free intraperitoneal air, abscess, or free fluid. No evidence of bowel obstruction. Vascular/Lymphatic: No pathologically enlarged lymph nodes. No evidence of abdominal aortic aneurysm. Reproductive: No mass or other significant abnormality. Other: None. Musculoskeletal:  No suspicious bone lesions identified. IMPRESSION: Mild worsening of moderate to severe sigmoid diverticulitis, with microperforation. No evidence of free intraperitoneal air or abscess. Electronically Signed   By: Earle Gell M.D.   On: 02/27/2015 19:56   Ct Abdomen Pelvis W Contrast  02/05/2015  CLINICAL DATA:  Lower abdominal pain for 3 days. History of diverticulitis. EXAM: CT ABDOMEN AND PELVIS WITH CONTRAST TECHNIQUE: Multidetector CT imaging of the  abdomen and pelvis was performed using the standard protocol following bolus administration of intravenous contrast. CONTRAST:  149mL OMNIPAQUE IOHEXOL 300 MG/ML  SOLN  COMPARISON:  None. FINDINGS: Lower chest:  Subpleural 4 mm nodule in the right lower lobe. Liver: No focal lesion. Hepatobiliary: Gallbladder physiologically distended, question tiny depending gallstone. No pericholecystic inflammatory change. No biliary dilatation. Pancreas: No ductal dilatation or surrounding inflammation. Spleen: Normal. Adrenal glands: No nodule. Kidneys: Symmetric renal enhancement and excretion. No hydronephrosis. No perinephric stranding or focal renal abnormality. Stomach/Bowel: Small hiatal hernia. Stomach decompressed. Small duodenum diverticulum. No dilated or thickened small bowel loops. There is diverticulosis of the distal colon. Colonic wall thickening with significant pericolonic fat stranding is seen about the mid sigmoid colon in the mid pelvis in a pattern consistent with diverticulitis, however the inflammation appears to span a moderate length segment of colon. There is no perforation or abscess. The appendix is normal. Vascular/Lymphatic: Small lower retroperitoneal lymph nodes, not enlarged right criteria Abdominal aorta is normal in caliber. Reproductive: Prostate gland normal in size. Bladder: Minimally distended, mild bladder wall thickening is likely reactive. No intravesicular air. Other: No pneumoperitoneum or abscess.  No ascites. Musculoskeletal: There are no acute or suspicious osseous abnormalities. Os acetabuli on the left hip. Probable avascular necrosis of the left femoral head. Mild degenerative change in the spine. IMPRESSION: 1. Colonic wall thickening with pericolonic inflammatory change in the setting of multiple diverticula involving the mid sigmoid colon. Findings favor acute diverticulitis, though the inflammation appears to span a moderate length of the sigmoid colon. Recommend follow-up  colonoscopy after course of treatment to exclude underlying neoplastic process. No perforation or abscess. 2. Urinary bladder wall thickening is likely reactive. 3. Incidental finding of avascular necrosis of the left femoral head. 4. Subpleural 4 mm nodule in the right lower lobe. If the patient is at high risk for bronchogenic carcinoma, follow-up chest CT at 1 year is recommended. If the patient is at low risk, no follow-up is needed. This recommendation follows the consensus statement: Guidelines for Management of Small Pulmonary Nodules Detected on CT Scans: A Statement from the [place] as published in Radiology 2005; 237:395-400. Electronically Signed   By: Jeb Levering M.D.   On: 02/05/2015 06:14         Subjective:  patient continues to complain of left lower quadrant and right lower quadrant abdominal pain. Has some nausea without emesis. Denies any fevers, chills, chest pain shortness breath, dysuria, hematuria. No headaches. Denies any dysuria or hematuria. Denies any hematochezia or melena.  Objective: Filed Vitals:   02/27/15 2115 02/27/15 2130 02/27/15 2236 02/28/15 0500  BP: 131/99 133/99 142/88 140/73  Pulse: 85 95 103 94  Temp:   98.5 F (36.9 C) 98.8 F (37.1 C)  TempSrc:   Oral Oral  Resp:   18 18  Height:   6' (1.829 m)   Weight:      SpO2: 95% 96% 97% 97%    Intake/Output Summary (Last 24 hours) at 02/28/15 0857 Last data filed at 02/28/15 0551  Gross per 24 hour  Intake 918.75 ml  Output      0 ml  Net 918.75 ml   Weight change:  Exam:   General:  Pt is alert, follows commands appropriately, not in acute distress  HEENT: No icterus, No thrush, No neck mass, [place]/AT  Cardiovascular: RRR, S1/S2, no rubs, no gallops  Respiratory: CTA bilaterally, no wheezing, no crackles, no rhonchi  Abdomen: Soft/+BS, LLQ, RLQ tender without rebound, non distended, no guarding  Extremities: No edema, No lymphangitis, No petechiae, No rashes, no  synovitis  Data Reviewed: Basic Metabolic Panel:  Recent Labs Lab 02/27/15 1505 02/28/15 0418  NA 132* 133*  K 3.4* 3.6  CL 96* 96*  CO2 25 27  GLUCOSE 114* 116*  BUN 12 6  CREATININE 1.14 0.93  CALCIUM 9.5 8.9   Liver Function Tests:  Recent Labs Lab 02/27/15 1505  AST 16  ALT 16*  ALKPHOS 57  BILITOT 0.6  PROT 7.6  ALBUMIN 3.6    Recent Labs Lab 02/27/15 1505  LIPASE 25   No results for input(s): AMMONIA in the last 168 hours. CBC:  Recent Labs Lab 02/27/15 1505 02/28/15 0418  WBC 12.1* 13.1*  HGB 13.2 12.4*  HCT 40.4 38.6*  MCV 87.3 88.3  PLT 291 243   Cardiac Enzymes: No results for input(s): CKTOTAL, CKMB, CKMBINDEX, TROPONINI in the last 168 hours. BNP: Invalid input(s): POCBNP CBG: No results for input(s): GLUCAP in the last 168 hours.  No results found for this or any previous visit (from the past 240 hour(s)).   Scheduled Meds: . cefTAZidime (FORTAZ)  IV  2 g Intravenous 3 times per day  . [START ON 03/01/2015] enoxaparin (LOVENOX) injection  55 mg Subcutaneous Q24H  . fenofibrate  160 mg Oral Daily  . folic acid  1 mg Oral Daily  . gabapentin  300 mg Oral QID  . lisinopril  10 mg Oral Daily  . loratadine  10 mg Oral Daily  . metronidazole  500 mg Intravenous Q8H  . multivitamin with minerals  1 tablet Oral Daily  . omega-3 acid ethyl esters  1 g Oral Daily  . pantoprazole  40 mg Oral Daily  . sertraline  50 mg Oral Daily  . thiamine  100 mg Oral Daily   Continuous Infusions: . sodium chloride 125 mL/hr at 02/28/15 0551     Baneza Bartoszek, DO  [facility] Pager [phone]  If 7PM-7AM, please contact night-coverage [email or web address] Password [email or web address] 02/28/2015, 8:57 AM   LOS: 1 day

## 2015-02-28 NOTE — Progress Notes (Signed)
Physical Therapy Note  Patient is functioning at a high level of independence and no physical therapy is indicated at this time. Tolerated higher level dynamic gait tasks without loss of balance. Denies any hip pain with daily tasks. States he follows orthopedic surgeon for back pain but has limited relief from treatments. Encouraged to share imaging information suggesting avascular necrosis of left hip with his surgeon, as his back pain may be referred from his avascular hip. PT is signing-off. Please re-order if there is any significant change in status. Thank you for this referral.  Elayne Snare, [employer]   02/28/2015  10:26

## 2015-02-28 NOTE — H&P (Addendum)
[facility] History and Physical  Russell Adams [id number] DOB: 06-03-1970 DOA: 02/27/2015  Referring physician: ED PCP: Leamon Arnt, MD   Chief Complaint: Abdominal pain  HPI:  Patient is a 44 year old male with a past medical history significant for diverticulitis, hypertension, PTSD, GERD, fatty liver disease, and  tobacco abuse; who presents with a 2 day history of right lower quadrant abdominal pain. Patient states that symptoms were progressively worsening. Pain is described as stabbing in nature. States that symptoms had started to radiate also on the left lower quadrant of his abdomen as well. He reported associated symptoms of chills, nausea, diarrhea/loose stools, and rectal pain with defecation and urination. Denies any fever, sweats, vomiting, or constipation.  He notes intermittent blood when wiping, but has a history of hemorrhoids and this is not new. Denied seeing gross blood per rectum. Patient notes similar symptoms to once a week she is presenting with his previous episode of diverticulitis which happened approximately 3 weeks ago and he was seen here at the ED. Patient notes that he was treated with a seven-day course of Cipro and Flagyl with complete resolution of symptoms until approximately 2 days ago.  Upon admission into the emergency department patient was evaluated with a CT of the abdomen which showed acute diverticulitis with microperforations of the sigmoid colon.  Review of Systems  Constitutional: Positive for chills and malaise/fatigue. Negative for diaphoresis.  HENT: Negative for ear discharge and ear pain.   Eyes: Negative for pain and discharge.  Respiratory: Negative for hemoptysis and sputum production.   Cardiovascular: Negative for chest pain, palpitations and leg swelling.  Gastrointestinal: Positive for nausea, abdominal pain and diarrhea. Negative for vomiting, constipation and melena.       Blood per rectum when wiping  Genitourinary:  Negative for urgency and frequency.  Musculoskeletal: Positive for back pain and joint pain. Negative for neck pain.  Skin: Negative for itching and rash.  Neurological: Negative for tremors and sensory change.  Endo/Heme/Allergies: Positive for environmental allergies. Negative for polydipsia.  Psychiatric/Behavioral: Negative for memory loss. The patient is nervous/anxious.        Past Medical History  Diagnosis Date  . Hypertension   . Hypercholesterolemia   . GERD (gastroesophageal reflux disease)   . Diverticulitis   . Depression   . Chronic back pain   . Barrett esophagus   . BPH (benign prostatic hyperplasia)   . IBS (irritable bowel syndrome)   . Hyperglyceridemia   . Fibromyalgia   . PTSD (post-traumatic stress disorder)   . Insomnia   . Allergic rhinitis   . Tobacco use   . Alcohol dependence ([facility])   . Arthritis   . Neuropathy [facility])      Past Surgical History  Procedure Laterality Date  . Hernia repair        Social History:  reports that he has been smoking Cigarettes.  He has been smoking about 0.50 packs per day. He has never used smokeless tobacco. He reports that he drinks alcohol. He reports that he does not use illicit drugs. Where does patient live--home   Can patient participate in ADLs?Yes  Allergies  Allergen Reactions  . Vitamin B12 Anxiety and Shortness Of Breath    Pills  . Metaxalone Anxiety    Family History  Problem Relation Age of Onset  . Heart attack Father   . Diabetes Father   . Alcoholism Father   . Drug abuse Father   . Hypertension Mother   . Diabetes  Sister   . Diabetes Sister         Prior to Admission medications   Medication Sig Start Date End Date Taking? Authorizing Provider  acetaminophen (TYLENOL) 500 MG tablet Take 1,000 mg by mouth every 6 (six) hours as needed for mild pain.   Yes Historical Provider, MD  cetirizine (ZYRTEC) 10 MG tablet Take 10 mg by mouth daily.    Yes Historical Provider, MD   clonazePAM (KLONOPIN) 1 MG tablet Take 1 mg by mouth 2 (two) times daily as needed for anxiety.  04/25/14  Yes Historical Provider, MD  fenofibrate (TRICOR) 145 MG tablet Take 145 mg by mouth daily.  03/27/14  Yes Historical Provider, MD  gabapentin (NEURONTIN) 300 MG capsule Take 300 mg by mouth 4 (four) times daily.   Yes Historical Provider, MD  lisinopril-hydrochlorothiazide (PRINZIDE,ZESTORETIC) 10-12.5 MG per tablet Take 1 tablet by mouth daily.  01/09/14  Yes Historical Provider, MD  omega-3 acid ethyl esters (LOVAZA) 1 G capsule Take 1 g by mouth daily.  05/12/14  Yes Historical Provider, MD  omeprazole (PRILOSEC) 20 MG capsule Take 20 mg by mouth 2 (two) times daily before a meal.  09/13/13  Yes Historical Provider, MD  sertraline (ZOLOFT) 50 MG tablet Take 50 mg by mouth daily.    Yes Historical Provider, MD  zolpidem (AMBIEN) 5 MG tablet Take 10 mg by mouth at bedtime as needed for sleep.    Yes Historical Provider, MD  ciprofloxacin (CIPRO) 500 MG tablet Take 1 tablet (500 mg total) by mouth 2 (two) times daily. 02/05/15   Merryl Hacker, MD     Physical Exam: Filed Vitals:   02/27/15 2100 02/27/15 2115 02/27/15 2130 02/27/15 2236  BP: 140/105 131/99 133/99 142/88  Pulse: 90 85 95 103  Temp:    98.5 F (36.9 C)  TempSrc:    Oral  Resp:    18  Height:    6' (1.829 m)  Weight:      SpO2: 94% 95% 96% 97%     Constitutional: Vital signs reviewed. Patient is sick appearing, but not toxic. In acute distress and cooperative with exam. Alert and oriented x3.  Head: Normocephalic and atraumatic  Ear: TM normal bilaterally  Mouth: no erythema or exudates, MMM  Eyes: PERRL, EOMI, conjunctivae normal, No scleral icterus.  Neck: Supple, Trachea midline normal ROM, No JVD, mass, thyromegaly, or carotid bruit present.  Cardiovascular: RRR, S1 normal, S2 normal, no MRG, pulses symmetric and intact bilaterally  Pulmonary/Chest: CTAB, no wheezes, rales, or rhonchi  Abdominal: Tenderness to  palpation on the right lower quadrant greater than the left lower quadrant. Positive bowel sounds in all 4 quadrants. No guarding. GU: no CVA tenderness Musculoskeletal: No joint deformities, erythema, or stiffness, ROM full and no nontender Ext: no edema and no cyanosis, pulses palpable bilaterally (DP and PT)  Hematology: no cervical, inginal, or axillary adenopathy.  Neurological: A&O x3, Strenght is normal and symmetric bilaterally, cranial nerve II-XII are grossly intact, no focal motor deficit, sensory intact to light touch bilaterally.  Skin: Warm, dry and intact. No rash, cyanosis, or clubbing.  Psychiatric: Normal mood and affect. speech and behavior is normal. Judgment and thought content normal. Cognition and memory are normal.      Data Review   Micro Results No results found for this or any previous visit (from the past 240 hour(s)).  Radiology Reports Ct Abdomen Pelvis W Contrast  02/27/2015  CLINICAL DATA:  Right lower quadrant pain for  several days. Nausea. Diarrhea. Diverticulitis. EXAM: CT ABDOMEN AND PELVIS WITH CONTRAST TECHNIQUE: Multidetector CT imaging of the abdomen and pelvis was performed using the standard protocol following bolus administration of intravenous contrast. CONTRAST:  139mL OMNIPAQUE IOHEXOL 300 MG/ML  SOLN COMPARISON:  02/05/2015 FINDINGS: Lower chest:  No acute findings. Hepatobiliary: No masses or other significant abnormality. Pancreas: No mass, inflammatory changes, or other significant abnormality. Spleen: Within normal limits in size and appearance. Adrenals/Urinary Tract: No masses identified. No evidence of hydronephrosis. Stomach/Bowel: Moderate to severe sigmoid diverticulitis shows mild worsening since previous study. Tiny extraluminal air bubbles are now seen in the adjacent sigmoid mesocolon, consistent with microperforation. No evidence of free intraperitoneal air, abscess, or free fluid. No evidence of bowel obstruction. Vascular/Lymphatic:  No pathologically enlarged lymph nodes. No evidence of abdominal aortic aneurysm. Reproductive: No mass or other significant abnormality. Other: None. Musculoskeletal:  No suspicious bone lesions identified. IMPRESSION: Mild worsening of moderate to severe sigmoid diverticulitis, with microperforation. No evidence of free intraperitoneal air or abscess. Electronically Signed   By: Earle Gell M.D.   On: 02/27/2015 19:56   Ct Abdomen Pelvis W Contrast  02/05/2015  CLINICAL DATA:  Lower abdominal pain for 3 days. History of diverticulitis. EXAM: CT ABDOMEN AND PELVIS WITH CONTRAST TECHNIQUE: Multidetector CT imaging of the abdomen and pelvis was performed using the standard protocol following bolus administration of intravenous contrast. CONTRAST:  158mL OMNIPAQUE IOHEXOL 300 MG/ML  SOLN COMPARISON:  None. FINDINGS: Lower chest:  Subpleural 4 mm nodule in the right lower lobe. Liver: No focal lesion. Hepatobiliary: Gallbladder physiologically distended, question tiny depending gallstone. No pericholecystic inflammatory change. No biliary dilatation. Pancreas: No ductal dilatation or surrounding inflammation. Spleen: Normal. Adrenal glands: No nodule. Kidneys: Symmetric renal enhancement and excretion. No hydronephrosis. No perinephric stranding or focal renal abnormality. Stomach/Bowel: Small hiatal hernia. Stomach decompressed. Small duodenum diverticulum. No dilated or thickened small bowel loops. There is diverticulosis of the distal colon. Colonic wall thickening with significant pericolonic fat stranding is seen about the mid sigmoid colon in the mid pelvis in a pattern consistent with diverticulitis, however the inflammation appears to span a moderate length segment of colon. There is no perforation or abscess. The appendix is normal. Vascular/Lymphatic: Small lower retroperitoneal lymph nodes, not enlarged right criteria Abdominal aorta is normal in caliber. Reproductive: Prostate gland normal in size.  Bladder: Minimally distended, mild bladder wall thickening is likely reactive. No intravesicular air. Other: No pneumoperitoneum or abscess.  No ascites. Musculoskeletal: There are no acute or suspicious osseous abnormalities. Os acetabuli on the left hip. Probable avascular necrosis of the left femoral head. Mild degenerative change in the spine. IMPRESSION: 1. Colonic wall thickening with pericolonic inflammatory change in the setting of multiple diverticula involving the mid sigmoid colon. Findings favor acute diverticulitis, though the inflammation appears to span a moderate length of the sigmoid colon. Recommend follow-up colonoscopy after course of treatment to exclude underlying neoplastic process. No perforation or abscess. 2. Urinary bladder wall thickening is likely reactive. 3. Incidental finding of avascular necrosis of the left femoral head. 4. Subpleural 4 mm nodule in the right lower lobe. If the patient is at high risk for bronchogenic carcinoma, follow-up chest CT at 1 year is recommended. If the patient is at low risk, no follow-up is needed. This recommendation follows the consensus statement: Guidelines for Management of Small Pulmonary Nodules Detected on CT Scans: A Statement from the [employer] as published in Radiology 2005; 237:395-400. Electronically Signed   By: Threasa Beards  Ehinger M.D.   On: 02/05/2015 06:14     CBC  Recent Labs Lab 02/27/15 1505  WBC 12.1*  HGB 13.2  HCT 40.4  PLT 291  MCV 87.3  MCH 28.5  MCHC 32.7  RDW 13.6    Chemistries   Recent Labs Lab 02/27/15 1505  NA 132*  K 3.4*  CL 96*  CO2 25  GLUCOSE 114*  BUN 12  CREATININE 1.14  CALCIUM 9.5  AST 16  ALT 16*  ALKPHOS 57  BILITOT 0.6   ------------------------------------------------------------------------------------------------------------------ estimated creatinine clearance is 88.9 mL/min (by C-G formula based on Cr of  1.14). ------------------------------------------------------------------------------------------------------------------ No results for input(s): HGBA1C in the last 72 hours. ------------------------------------------------------------------------------------------------------------------ No results for input(s): CHOL, HDL, LDLCALC, TRIG, CHOLHDL, LDLDIRECT in the last 72 hours. ------------------------------------------------------------------------------------------------------------------ No results for input(s): TSH, T4TOTAL, T3FREE, THYROIDAB in the last 72 hours.  Invalid input(s): FREET3 ------------------------------------------------------------------------------------------------------------------ No results for input(s): VITAMINB12, FOLATE, FERRITIN, TIBC, IRON, RETICCTPCT in the last 72 hours.  Coagulation profile No results for input(s): INR, PROTIME in the last 168 hours.  No results for input(s): DDIMER in the last 72 hours.  Cardiac Enzymes No results for input(s): CKMB, TROPONINI, MYOGLOBIN in the last 168 hours.  Invalid input(s): CK ------------------------------------------------------------------------------------------------------------------ Invalid input(s): POCBNP   CBG: No results for input(s): GLUCAP in the last 168 hours.        Assessment/Plan Active Problems:    Diverticulitis of the sigmoid colon. Acute. Patient with history of 2 days of right lower quadrant abdominal pain previous history of similar symptoms. On admission WBC 12.1, heart rate 104. CT scan showing diverticulitis of the sigmoid colon with microperforations. -allow for bowel rest except for meds and clear liquids as tolerated -IV fluids normal saline and 125 mL/h -check blood cultures x2 -Morphine/Dilaudid as needed for moderate to severe pain -*Addendum-Discontinued antibiotics of metronidazole and broaden antibiotic coverage to include vancomycin, Fortaz, and metronidazole as  patient may need healthcare associated coverage with the recent ED stay and worsening lab work. -Reassess for her ability to de-escalate antibiotic regimen and/or possible need of surgical consultation if patient not clinically improving  Avascular necrosis of the left femoral head incidental finding on CT. -This will likely need further investigation and May warrant ortho evaluation  -Physical therapy to eval and treat  Hypertension: Blood pressure stable -continue lisinopril -Held HCTZ  Hyponatremia: Sodium levels initially decreased at 132. Suspect secondary to decreased by mouth intake and HCTZ. -IV fluids as seen above  Hypokalemia. Patient initial potassium 3.4 and only mildly decreased. -20 mEq of potassium chloride  -Follow-up repeat BMP and replace as needed  GERD  -Continue Protonix   Dyslipidemia -Continue fenofibrate and fish oil supplement  PTSD history -Continue Zoloft  Alcohol abuse. Patient reports drinking a sixpack of beers per day. Reports last drink was 02/26/2015. Denies any previous history of alcohol withdrawals. -CWIAA protocols  Code Status:   full Family Communication: bedside Disposition Plan: admit   Total time spent 55 minutes.Greater than 50% of this time was spent in counseling, explanation of diagnosis, planning of further management, and coordination of care  [facility] Pager [phone]  If 7PM-7AM, please contact night-coverage [email or web address] Password [email or web address] 02/28/2015, 5:01 AM

## 2015-03-01 ENCOUNTER — Encounter (HOSPITAL_COMMUNITY): Payer: Self-pay | Admitting: General Practice

## 2015-03-01 DIAGNOSIS — E7800 Pure hypercholesterolemia, unspecified: Secondary | ICD-10-CM

## 2015-03-01 LAB — COMPREHENSIVE METABOLIC PANEL
ALK PHOS: 46 U/L (ref 38–126)
ALT: 12 U/L — AB (ref 17–63)
ANION GAP: 8 (ref 5–15)
AST: 12 U/L — ABNORMAL LOW (ref 15–41)
Albumin: 2.9 g/dL — ABNORMAL LOW (ref 3.5–5.0)
BILIRUBIN TOTAL: 0.8 mg/dL (ref 0.3–1.2)
BUN: 9 mg/dL (ref 6–20)
CALCIUM: 9 mg/dL (ref 8.9–10.3)
CO2: 24 mmol/L (ref 22–32)
CREATININE: 0.93 mg/dL (ref 0.61–1.24)
Chloride: 102 mmol/L (ref 101–111)
Glucose, Bld: 107 mg/dL — ABNORMAL HIGH (ref 65–99)
Potassium: 3.9 mmol/L (ref 3.5–5.1)
Sodium: 134 mmol/L — ABNORMAL LOW (ref 135–145)
TOTAL PROTEIN: 7.2 g/dL (ref 6.5–8.1)

## 2015-03-01 LAB — CBC
HCT: 37.8 % — ABNORMAL LOW (ref 39.0–52.0)
HEMOGLOBIN: 12.5 g/dL — AB (ref 13.0–17.0)
MCH: 29.1 pg (ref 26.0–34.0)
MCHC: 33.1 g/dL (ref 30.0–36.0)
MCV: 88.1 fL (ref 78.0–100.0)
PLATELETS: 244 10*3/uL (ref 150–400)
RBC: 4.29 MIL/uL (ref 4.22–5.81)
RDW: 13.7 % (ref 11.5–15.5)
WBC: 12.1 10*3/uL — ABNORMAL HIGH (ref 4.0–10.5)

## 2015-03-01 MED ORDER — ENOXAPARIN SODIUM 60 MG/0.6ML ~~LOC~~ SOLN: 50.0000 mg | SUBCUTANEOUS | Status: DC

## 2015-03-01 MED ORDER — ENOXAPARIN SODIUM 40 MG/0.4ML ~~LOC~~ SOLN
40.0000 mg | SUBCUTANEOUS | Status: DC
  Administered 2015-03-02 – 2015-03-03 (×2): 40 mg via SUBCUTANEOUS
  Filled 2015-03-01 (×2): qty 0.4

## 2015-03-01 NOTE — Progress Notes (Deleted)
5mg  oxycodone tablet was found in patient's medication drawer. Three doses were pulled from pyxis during night shift, but medication was only administered twice in that time period. Tablet returned to pyxis.  Joellen Jersey, RN.

## 2015-03-01 NOTE — Progress Notes (Signed)
PROGRESS NOTE  Russell Adams [id number] DOB: Aug 21, 1970 DOA: 02/27/2015 PCP: Leamon Arnt, MD  Brief History 44 year old male with a history of hypertension, hyperlipidemia, diverticulitis, depression, PTSD, tobacco and alcohol dependence presented with 2 day history of right lower quadrant abdominal pain that radiates to the left lower quadrant. He has had some nausea without any vomiting. He has had some loose stools without any hematochezia or melena. He does complain of some rectal pain and abdominal pain with bowel movements. The patient was treated with Cipro and Flagyl after an emergency department visit on 02/05/2015 for diverticulitis. CT of the abdomen and pelvis on 02/05/2015 revealed sigmoid diverticulitis. Repeat CT abdomen and pelvis 02/27/2015 showed mild worsening with microperforation. The patient was admitted for intravenous fluids and intravenous antibiotics. General surgery was consulted. Assessment/Plan: Acute diverticulitis with microperforation -Patient claims that he took Cipro and Flagyl, but doubt that he was compliant as he drank alcohol at the end of the abx regimen with concomitant disulfiram effect from the patient's metronidazole -02/27/2015 CT abdomen and pelvis--moderate to severe sigmoid diverticulitis with mild worsening and microperforation -Consulted general surgery as this is the patient's third episode in the past 3-4 years -General surgery input appreciated -remain npo except sips with meds -Continue IV fluids -Continue opioids for pain control--states there is some improvement -Continue intravenous antibiotics -Discontinue vancomycin; continue zosyn -Follow up blood cultures--NGTD Avascular necrosis--left femoral head -Incidental finding on CT -Patient is asymptomatic  -Will need outpatient orthopedics follow-up  Hypertension  -controlled  -Continue lisinopril  Hyponatremia  -Secondary to HCTZ and volume depletion  -Improving  with IV fluids Dyslipidemia  -Continue fenofibrate and Lovaza  PTSD history  -Continue Zoloft  Alcohol dependence  -Drinks 6 pack beer per day  -CIWA protocol -last drink 02/26/15 GERD -continue PPI Tobacco abuse/Cannibis -Tobacco cessation discussed -NicoDerm patch   Family Communication: Pt at beside Disposition Plan: Home 2-3 days when cleared by surgery    Procedures/Studies: Ct Abdomen Pelvis W Contrast  02/27/2015  CLINICAL DATA:  Right lower quadrant pain for several days. Nausea. Diarrhea. Diverticulitis. EXAM: CT ABDOMEN AND PELVIS WITH CONTRAST TECHNIQUE: Multidetector CT imaging of the abdomen and pelvis was performed using the standard protocol following bolus administration of intravenous contrast. CONTRAST:  14mL OMNIPAQUE IOHEXOL 300 MG/ML  SOLN COMPARISON:  02/05/2015 FINDINGS: Lower chest:  No acute findings. Hepatobiliary: No masses or other significant abnormality. Pancreas: No mass, inflammatory changes, or other significant abnormality. Spleen: Within normal limits in size and appearance. Adrenals/Urinary Tract: No masses identified. No evidence of hydronephrosis. Stomach/Bowel: Moderate to severe sigmoid diverticulitis shows mild worsening since previous study. Tiny extraluminal air bubbles are now seen in the adjacent sigmoid mesocolon, consistent with microperforation. No evidence of free intraperitoneal air, abscess, or free fluid. No evidence of bowel obstruction. Vascular/Lymphatic: No pathologically enlarged lymph nodes. No evidence of abdominal aortic aneurysm. Reproductive: No mass or other significant abnormality. Other: None. Musculoskeletal:  No suspicious bone lesions identified. IMPRESSION: Mild worsening of moderate to severe sigmoid diverticulitis, with microperforation. No evidence of free intraperitoneal air or abscess. Electronically Signed   By: Earle Gell M.D.   On: 02/27/2015 19:56   Ct Abdomen Pelvis W Contrast  02/05/2015  CLINICAL DATA:   Lower abdominal pain for 3 days. History of diverticulitis. EXAM: CT ABDOMEN AND PELVIS WITH CONTRAST TECHNIQUE: Multidetector CT imaging of the abdomen and pelvis was performed using the standard protocol following bolus administration of intravenous contrast. CONTRAST:  124mL  OMNIPAQUE IOHEXOL 300 MG/ML  SOLN COMPARISON:  None. FINDINGS: Lower chest:  Subpleural 4 mm nodule in the right lower lobe. Liver: No focal lesion. Hepatobiliary: Gallbladder physiologically distended, question tiny depending gallstone. No pericholecystic inflammatory change. No biliary dilatation. Pancreas: No ductal dilatation or surrounding inflammation. Spleen: Normal. Adrenal glands: No nodule. Kidneys: Symmetric renal enhancement and excretion. No hydronephrosis. No perinephric stranding or focal renal abnormality. Stomach/Bowel: Small hiatal hernia. Stomach decompressed. Small duodenum diverticulum. No dilated or thickened small bowel loops. There is diverticulosis of the distal colon. Colonic wall thickening with significant pericolonic fat stranding is seen about the mid sigmoid colon in the mid pelvis in a pattern consistent with diverticulitis, however the inflammation appears to span a moderate length segment of colon. There is no perforation or abscess. The appendix is normal. Vascular/Lymphatic: Small lower retroperitoneal lymph nodes, not enlarged right criteria Abdominal aorta is normal in caliber. Reproductive: Prostate gland normal in size. Bladder: Minimally distended, mild bladder wall thickening is likely reactive. No intravesicular air. Other: No pneumoperitoneum or abscess.  No ascites. Musculoskeletal: There are no acute or suspicious osseous abnormalities. Os acetabuli on the left hip. Probable avascular necrosis of the left femoral head. Mild degenerative change in the spine. IMPRESSION: 1. Colonic wall thickening with pericolonic inflammatory change in the setting of multiple diverticula involving the mid sigmoid  colon. Findings favor acute diverticulitis, though the inflammation appears to span a moderate length of the sigmoid colon. Recommend follow-up colonoscopy after course of treatment to exclude underlying neoplastic process. No perforation or abscess. 2. Urinary bladder wall thickening is likely reactive. 3. Incidental finding of avascular necrosis of the left femoral head. 4. Subpleural 4 mm nodule in the right lower lobe. If the patient is at high risk for bronchogenic carcinoma, follow-up chest CT at 1 year is recommended. If the patient is at low risk, no follow-up is needed. This recommendation follows the consensus statement: Guidelines for Management of Small Pulmonary Nodules Detected on CT Scans: A Statement from the [employer] as published in Radiology 2005; 237:395-400. Electronically Signed   By: Jeb Levering M.D.   On: 02/05/2015 06:14         Subjective: Patient still complains of left lower quadrant pain greater than right lower quadrant pain which is somewhat improved from yesterday. Denies any fevers, shows, chest pain, soreness breath, nausea, vomiting, diarrhea. He had a bowel movement. Medication melena.  Objective: Filed Vitals:   02/28/15 1829 02/28/15 2100 03/01/15 0500 03/01/15 1000  BP: 124/80 97/64 118/73 134/74  Pulse: 84 101 91 86  Temp: 98 F (36.7 C) 99.1 F (37.3 C) 99 F (37.2 C) 97.9 F (36.6 C)  TempSrc: Oral Oral Oral Oral  Resp: 18 18 18 16   Height:  6' (1.829 m)    Weight:  92.1 kg (203 lb 0.7 oz)    SpO2: 98% 97% 96% 98%    Intake/Output Summary (Last 24 hours) at 03/01/15 1809 Last data filed at 03/01/15 1430  Gross per 24 hour  Intake      0 ml  Output      0 ml  Net      0 ml   Weight change: -0.887 kg (-1 lb 15.3 oz) Exam:   General:  Pt is alert, follows commands appropriately, not in acute distress  HEENT: No icterus, No thrush, No neck mass, [place]/AT  Cardiovascular: RRR, S1/S2, no rubs, no gallops  Respiratory: CTA  bilaterally, no wheezing, no crackles, no rhonchi  Abdomen: Soft/+BS,  LLQ, RLQ tender without rebound, no guarding  Extremities: No edema, No lymphangitis, No petechiae, No rashes, no synovitis  Data Reviewed: Basic Metabolic Panel:  Recent Labs Lab 02/27/15 1505 02/28/15 0418 03/01/15 0635  NA 132* 133* 134*  K 3.4* 3.6 3.9  CL 96* 96* 102  CO2 25 27 24   GLUCOSE 114* 116* 107*  BUN 12 6 9   CREATININE 1.14 0.93 0.93  CALCIUM 9.5 8.9 9.0   Liver Function Tests:  Recent Labs Lab 02/27/15 1505 03/01/15 0635  AST 16 12*  ALT 16* 12*  ALKPHOS 57 46  BILITOT 0.6 0.8  PROT 7.6 7.2  ALBUMIN 3.6 2.9*    Recent Labs Lab 02/27/15 1505  LIPASE 25   No results for input(s): AMMONIA in the last 168 hours. CBC:  Recent Labs Lab 02/27/15 1505 02/28/15 0418 03/01/15 0635  WBC 12.1* 13.1* 12.1*  HGB 13.2 12.4* 12.5*  HCT 40.4 38.6* 37.8*  MCV 87.3 88.3 88.1  PLT 291 243 244   Cardiac Enzymes: No results for input(s): CKTOTAL, CKMB, CKMBINDEX, TROPONINI in the last 168 hours. BNP: Invalid input(s): POCBNP CBG: No results for input(s): GLUCAP in the last 168 hours.  Recent Results (from the past 240 hour(s))  Culture, blood (routine x 2)     Status: None (Preliminary result)   Collection Time: 02/28/15  6:34 AM  Result Value Ref Range Status   Specimen Description BLOOD LEFT ARM  Final   Special Requests BOTTLES DRAWN AEROBIC AND ANAEROBIC 10CC  Final   Culture NO GROWTH 1 DAY  Final   Report Status PENDING  Incomplete  Culture, blood (routine x 2)     Status: None (Preliminary result)   Collection Time: 02/28/15  6:35 AM  Result Value Ref Range Status   Specimen Description BLOOD LEFT HAND  Final   Special Requests BOTTLES DRAWN AEROBIC AND ANAEROBIC 10CC  Final   Culture NO GROWTH 1 DAY  Final   Report Status PENDING  Incomplete     Scheduled Meds: . [START ON 03/02/2015] enoxaparin (LOVENOX) injection  40 mg Subcutaneous Q24H  . fenofibrate  160 mg Oral  Daily  . folic acid  1 mg Oral Daily  . gabapentin  300 mg Oral QID  . lisinopril  10 mg Oral Daily  . loratadine  10 mg Oral Daily  . multivitamin with minerals  1 tablet Oral Daily  . nicotine  7 mg Transdermal Daily  . omega-3 acid ethyl esters  1 g Oral Daily  . pantoprazole  40 mg Oral Daily  . piperacillin-tazobactam (ZOSYN)  IV  3.375 g Intravenous 3 times per day  . sertraline  50 mg Oral Daily  . thiamine  100 mg Oral Daily   Continuous Infusions: . sodium chloride 0.9 % 1,000 mL with potassium chloride 20 mEq infusion 75 mL/hr at 03/01/15 1431     Domique Clapper, DO  [facility] Pager [phone]  If 7PM-7AM, please contact night-coverage [email or web address] Password [email or web address] 03/01/2015, 6:09 PM   LOS: 2 days

## 2015-03-01 NOTE — Progress Notes (Signed)
Patient ID: Russell Adams, male   DOB: 09-14-1970, 44 y.o.   MRN: [id number]    Subjective: Pt still complaining of abdominal pain today.  States that is it a little better, but says his pain meds aren't holding him long enough.  No appetite.  Objective: Vital signs in last 24 hours: Temp:  [98 F (36.7 C)-99.1 F (37.3 C)] 99 F (37.2 C) (12/08 0500) Pulse Rate:  [84-101] 91 (12/08 0500) Resp:  [18] 18 (12/08 0500) BP: (97-134)/(64-80) 118/73 mmHg (12/08 0500) SpO2:  [96 %-98 %] 96 % (12/08 0500) Weight:  [92.1 kg (203 lb 0.7 oz)] 92.1 kg (203 lb 0.7 oz) (12/07 2100) Last BM Date: 02/27/15  Intake/Output from previous day: 12/07 0701 - 12/08 0700 In: 240 [P.O.:240] Out: 700 [Urine:700] Intake/Output this shift:    PE: Abd: soft, still quite tender throughout his lower abdomen, +BS, ND Heart: regular Lungs: CTAB  Lab Results:   Recent Labs  02/28/15 0418 03/01/15 0635  WBC 13.1* 12.1*  HGB 12.4* 12.5*  HCT 38.6* 37.8*  PLT 243 244   BMET  Recent Labs  02/28/15 0418 03/01/15 0635  NA 133* 134*  K 3.6 3.9  CL 96* 102  CO2 27 24  GLUCOSE 116* 107*  BUN 6 9  CREATININE 0.93 0.93  CALCIUM 8.9 9.0   PT/INR No results for input(s): LABPROT, INR in the last 72 hours. CMP     Component Value Date/Time   NA 134* 03/01/2015 0635   K 3.9 03/01/2015 0635   CL 102 03/01/2015 0635   CO2 24 03/01/2015 0635   GLUCOSE 107* 03/01/2015 0635   BUN 9 03/01/2015 0635   CREATININE 0.93 03/01/2015 0635   CALCIUM 9.0 03/01/2015 0635   PROT 7.2 03/01/2015 0635   PROT 6.9 05/26/2014 1030   ALBUMIN 2.9* 03/01/2015 0635   AST 12* 03/01/2015 0635   ALT 12* 03/01/2015 0635   ALKPHOS 46 03/01/2015 0635   BILITOT 0.8 03/01/2015 0635   GFRNONAA >60 03/01/2015 0635   GFRAA >60 03/01/2015 0635   Lipase     Component Value Date/Time   LIPASE 25 02/27/2015 1505       Studies/Results: Ct Abdomen Pelvis W Contrast  02/27/2015  CLINICAL DATA:  Right lower quadrant pain  for several days. Nausea. Diarrhea. Diverticulitis. EXAM: CT ABDOMEN AND PELVIS WITH CONTRAST TECHNIQUE: Multidetector CT imaging of the abdomen and pelvis was performed using the standard protocol following bolus administration of intravenous contrast. CONTRAST:  15mL OMNIPAQUE IOHEXOL 300 MG/ML  SOLN COMPARISON:  02/05/2015 FINDINGS: Lower chest:  No acute findings. Hepatobiliary: No masses or other significant abnormality. Pancreas: No mass, inflammatory changes, or other significant abnormality. Spleen: Within normal limits in size and appearance. Adrenals/Urinary Tract: No masses identified. No evidence of hydronephrosis. Stomach/Bowel: Moderate to severe sigmoid diverticulitis shows mild worsening since previous study. Tiny extraluminal air bubbles are now seen in the adjacent sigmoid mesocolon, consistent with microperforation. No evidence of free intraperitoneal air, abscess, or free fluid. No evidence of bowel obstruction. Vascular/Lymphatic: No pathologically enlarged lymph nodes. No evidence of abdominal aortic aneurysm. Reproductive: No mass or other significant abnormality. Other: None. Musculoskeletal:  No suspicious bone lesions identified. IMPRESSION: Mild worsening of moderate to severe sigmoid diverticulitis, with microperforation. No evidence of free intraperitoneal air or abscess. Electronically Signed   By: Earle Gell M.D.   On: 02/27/2015 19:56    Anti-infectives: Anti-infectives    Start     Dose/Rate Route Frequency Ordered Stop  02/28/15 1400  piperacillin-tazobactam (ZOSYN) IVPB 3.375 g  Status:  Discontinued     3.375 g 100 mL/hr over 30 Minutes Intravenous 3 times per day 02/28/15 1055 02/28/15 1103   02/28/15 1400  piperacillin-tazobactam (ZOSYN) IVPB 3.375 g     3.375 g 12.5 mL/hr over 240 Minutes Intravenous 3 times per day 02/28/15 1104     02/28/15 1200  vancomycin (VANCOCIN) IVPB 1000 mg/200 mL premix  Status:  Discontinued     1,000 mg 200 mL/hr over 60 Minutes  Intravenous Every 8 hours 02/28/15 0544 02/28/15 0848   02/28/15 1000  ciprofloxacin (CIPRO) IVPB 400 mg  Status:  Discontinued     400 mg 200 mL/hr over 60 Minutes Intravenous Every 12 hours 02/27/15 2210 02/28/15 0533   02/28/15 0600  cefTAZidime (FORTAZ) 2 g in dextrose 5 % 50 mL IVPB  Status:  Discontinued     2 g 100 mL/hr over 30 Minutes Intravenous 3 times per day 02/28/15 0533 02/28/15 1055   02/28/15 0545  vancomycin (VANCOCIN) IVPB 1000 mg/200 mL premix  Status:  Discontinued     1,000 mg 200 mL/hr over 60 Minutes Intravenous  Once 02/28/15 0533 02/28/15 0542   02/28/15 0545  vancomycin (VANCOCIN) IVPB 1000 mg/200 mL premix     1,000 mg 200 mL/hr over 60 Minutes Intravenous  Once 02/28/15 0544 02/28/15 0714   02/28/15 0430  metroNIDAZOLE (FLAGYL) IVPB 500 mg  Status:  Discontinued     500 mg 100 mL/hr over 60 Minutes Intravenous Every 8 hours 02/27/15 2213 02/28/15 1055   02/27/15 2230  metroNIDAZOLE (FLAGYL) IVPB 500 mg  Status:  Discontinued     500 mg 100 mL/hr over 60 Minutes Intravenous Every 8 hours 02/27/15 2210 02/27/15 2213   02/27/15 2100  metroNIDAZOLE (FLAGYL) IVPB 1 g     1 g 200 mL/hr over 60 Minutes Intravenous  Once 02/27/15 2006 02/27/15 2128   02/27/15 2015  ciprofloxacin (CIPRO) IVPB 400 mg     400 mg 200 mL/hr over 60 Minutes Intravenous  Once 02/27/15 2006 02/27/15 2128       Assessment/Plan  1. Diverticulitis with microperforation -patient still with quite a bit of abdominal pain.  WBC has decreased to 12K.  Would continue NPO and bowel rest given minimal change to his abdominal pain. -cont zosyn -will cont conservative management and try to avoid surgical intervention.   LOS: 2 days    Laymond Postle E 03/01/2015, 8:02 AM Pager: [phone]

## 2015-03-01 NOTE — Progress Notes (Signed)
Pharmacy may adjust Lovenox dose for VTE prophylaxis.  44 y.o male, weigh 203 lb (92.1 kg), height 6 ft.   BMI =27.6.  SCr 0.93, CrCl >100 ml/min, Hgb stable at 12.5, pltc 244K. BMI is <30, thus Lovenox 40mg  SQ q24h is appropriate for VTE ppx.  (no adjustment to 0.5mg /kg q24h necessary unless BMI is >30)  Plan:  Lovenox 40 mg SQ q24h   Nicole Cella, RPh Clinical Pharmacist Pager: [phone]

## 2015-03-01 NOTE — Progress Notes (Signed)
Utilization review completed. Melvina Pangelinan, RN, BSN. 

## 2015-03-02 LAB — CBC
HCT: 36.4 % — ABNORMAL LOW (ref 39.0–52.0)
Hemoglobin: 11.9 g/dL — ABNORMAL LOW (ref 13.0–17.0)
MCH: 28.8 pg (ref 26.0–34.0)
MCHC: 32.7 g/dL (ref 30.0–36.0)
MCV: 88.1 fL (ref 78.0–100.0)
Platelets: 247 K/uL (ref 150–400)
RBC: 4.13 MIL/uL — ABNORMAL LOW (ref 4.22–5.81)
RDW: 13.6 % (ref 11.5–15.5)
WBC: 11.9 K/uL — ABNORMAL HIGH (ref 4.0–10.5)

## 2015-03-02 LAB — BASIC METABOLIC PANEL
ANION GAP: 10 (ref 5–15)
BUN: 9 mg/dL (ref 6–20)
CALCIUM: 8.9 mg/dL (ref 8.9–10.3)
CHLORIDE: 101 mmol/L (ref 101–111)
CO2: 24 mmol/L (ref 22–32)
Creatinine, Ser: 0.99 mg/dL (ref 0.61–1.24)
GFR calc non Af Amer: >60 mL/min (ref >60)
Glucose, Bld: 105 mg/dL — ABNORMAL HIGH (ref 65–99)
POTASSIUM: 4.4 mmol/L (ref 3.5–5.1)
Sodium: 135 mmol/L (ref 135–145)

## 2015-03-02 NOTE — Progress Notes (Signed)
Patient ID: Russell Adams, male   DOB: 1970-12-09, 44 y.o.   MRN: [id number]    Subjective: Pt feels a little better.    Objective: Vital signs in last 24 hours: Temp:  [97.9 F (36.6 C)-100 F (37.8 C)] 99.1 F (37.3 C) (12/09 0607) Pulse Rate:  [86-99] 91 (12/09 0607) Resp:  [14-18] 14 (12/09 0607) BP: (118-143)/(59-76) 118/59 mmHg (12/09 0607) SpO2:  [96 %-98 %] 96 % (12/09 0607) Weight:  [91.984 kg (202 lb 12.6 oz)] 91.984 kg (202 lb 12.6 oz) (12/08 2203) Last BM Date: 02/27/15  Intake/Output from previous day:   Intake/Output this shift:    PE: Abd: soft, still tender throughout lower abdomen, but improved some, +BS, ND  Lab Results:   Recent Labs  03/01/15 0635 03/02/15 0233  WBC 12.1* 11.9*  HGB 12.5* 11.9*  HCT 37.8* 36.4*  PLT 244 247   BMET  Recent Labs  03/01/15 0635 03/02/15 0233  NA 134* 135  K 3.9 4.4  CL 102 101  CO2 24 24  GLUCOSE 107* 105*  BUN 9 9  CREATININE 0.93 0.99  CALCIUM 9.0 8.9   PT/INR No results for input(s): LABPROT, INR in the last 72 hours. CMP     Component Value Date/Time   NA 135 03/02/2015 0233   K 4.4 03/02/2015 0233   CL 101 03/02/2015 0233   CO2 24 03/02/2015 0233   GLUCOSE 105* 03/02/2015 0233   BUN 9 03/02/2015 0233   CREATININE 0.99 03/02/2015 0233   CALCIUM 8.9 03/02/2015 0233   PROT 7.2 03/01/2015 0635   PROT 6.9 05/26/2014 1030   ALBUMIN 2.9* 03/01/2015 0635   AST 12* 03/01/2015 0635   ALT 12* 03/01/2015 0635   ALKPHOS 46 03/01/2015 0635   BILITOT 0.8 03/01/2015 0635   GFRNONAA >60 03/02/2015 0233   GFRAA >60 03/02/2015 0233   Lipase     Component Value Date/Time   LIPASE 25 02/27/2015 1505       Studies/Results: No results found.  Anti-infectives: Anti-infectives    Start     Dose/Rate Route Frequency Ordered Stop   02/28/15 1400  piperacillin-tazobactam (ZOSYN) IVPB 3.375 g  Status:  Discontinued     3.375 g 100 mL/hr over 30 Minutes Intravenous 3 times per day 02/28/15 1055  02/28/15 1103   02/28/15 1400  piperacillin-tazobactam (ZOSYN) IVPB 3.375 g     3.375 g 12.5 mL/hr over 240 Minutes Intravenous 3 times per day 02/28/15 1104     02/28/15 1200  vancomycin (VANCOCIN) IVPB 1000 mg/200 mL premix  Status:  Discontinued     1,000 mg 200 mL/hr over 60 Minutes Intravenous Every 8 hours 02/28/15 0544 02/28/15 0848   02/28/15 1000  ciprofloxacin (CIPRO) IVPB 400 mg  Status:  Discontinued     400 mg 200 mL/hr over 60 Minutes Intravenous Every 12 hours 02/27/15 2210 02/28/15 0533   02/28/15 0600  cefTAZidime (FORTAZ) 2 g in dextrose 5 % 50 mL IVPB  Status:  Discontinued     2 g 100 mL/hr over 30 Minutes Intravenous 3 times per day 02/28/15 0533 02/28/15 1055   02/28/15 0545  vancomycin (VANCOCIN) IVPB 1000 mg/200 mL premix  Status:  Discontinued     1,000 mg 200 mL/hr over 60 Minutes Intravenous  Once 02/28/15 0533 02/28/15 0542   02/28/15 0545  vancomycin (VANCOCIN) IVPB 1000 mg/200 mL premix     1,000 mg 200 mL/hr over 60 Minutes Intravenous  Once 02/28/15 0544 02/28/15 0714  02/28/15 0430  metroNIDAZOLE (FLAGYL) IVPB 500 mg  Status:  Discontinued     500 mg 100 mL/hr over 60 Minutes Intravenous Every 8 hours 02/27/15 2213 02/28/15 1055   02/27/15 2230  metroNIDAZOLE (FLAGYL) IVPB 500 mg  Status:  Discontinued     500 mg 100 mL/hr over 60 Minutes Intravenous Every 8 hours 02/27/15 2210 02/27/15 2213   02/27/15 2100  metroNIDAZOLE (FLAGYL) IVPB 1 g     1 g 200 mL/hr over 60 Minutes Intravenous  Once 02/27/15 2006 02/27/15 2128   02/27/15 2015  ciprofloxacin (CIPRO) IVPB 400 mg     400 mg 200 mL/hr over 60 Minutes Intravenous  Once 02/27/15 2006 02/27/15 2128       Assessment/Plan  1. Diverticulitis with microperforation -suspect fibromyalgia playing some role in pain as his vitals are stable and his WBC is trending down.   -will give clear liquids today and see how he does -cont zosyn   LOS: 3 days    Kalima Saylor E 03/02/2015, 9:05 AM Pager:  [phone]

## 2015-03-02 NOTE — Progress Notes (Signed)
PROGRESS NOTE  Russell Adams [id number] DOB: 1970/06/26 DOA: 02/27/2015 PCP: Leamon Arnt, MD  Brief History 44 year old male with a history of hypertension, hyperlipidemia, diverticulitis, depression, PTSD, tobacco and alcohol dependence presented with 2 day history of right lower quadrant abdominal pain that radiates to the left lower quadrant. He has had some nausea without any vomiting. He has had some loose stools without any hematochezia or melena. He does complain of some rectal pain and abdominal pain with bowel movements. The patient was treated with Cipro and Flagyl after an emergency department visit on 02/05/2015 for diverticulitis. CT of the abdomen and pelvis on 02/05/2015 revealed sigmoid diverticulitis. Repeat CT abdomen and pelvis 02/27/2015 showed mild worsening with microperforation. The patient was admitted for intravenous fluids and intravenous antibiotics. General surgery was consulted. Assessment/Plan: Acute diverticulitis with microperforation -Patient claims that he took Cipro and Flagyl, but doubt that he was compliant as he drank alcohol at the end of the abx regimen with concomitant disulfiram effect from the patient's metronidazole -02/27/2015 CT abdomen and pelvis--moderate to severe sigmoid diverticulitis with mild worsening and microperforation -Consulted general surgery as this is the patient's third episode in the past 3-4 years -General surgery input appreciated -03/02/15--started clear liquids -Continue IV fluids -Continue opioids for pain control--states there is continued improvement -Continue intravenous antibiotics -Discontinue vancomycin; continue zosyn -Follow up blood cultures--NGTD Avascular necrosis--left femoral head -Incidental finding on CT -Patient is asymptomatic  -Will need outpatient orthopedics follow-up  Hypertension  -controlled  -Continue lisinopril  Hyponatremia  -Secondary to HCTZ and volume depletion   -Improving with IV fluids Dyslipidemia  -Continue fenofibrate and Lovaza  PTSD history  -Continue Zoloft  Alcohol dependence  -Drinks 6 pack beer per day  -CIWA protocol -last drink 02/26/15 GERD -continue PPI Tobacco abuse/Cannibis -Tobacco cessation discussed -NicoDerm patch   Family Communication: Pt at beside Disposition Plan: Home 2-3 days when cleared by surgery    Procedures/Studies: Ct Abdomen Pelvis W Contrast  02/27/2015  CLINICAL DATA:  Right lower quadrant pain for several days. Nausea. Diarrhea. Diverticulitis. EXAM: CT ABDOMEN AND PELVIS WITH CONTRAST TECHNIQUE: Multidetector CT imaging of the abdomen and pelvis was performed using the standard protocol following bolus administration of intravenous contrast. CONTRAST:  178mL OMNIPAQUE IOHEXOL 300 MG/ML  SOLN COMPARISON:  02/05/2015 FINDINGS: Lower chest:  No acute findings. Hepatobiliary: No masses or other significant abnormality. Pancreas: No mass, inflammatory changes, or other significant abnormality. Spleen: Within normal limits in size and appearance. Adrenals/Urinary Tract: No masses identified. No evidence of hydronephrosis. Stomach/Bowel: Moderate to severe sigmoid diverticulitis shows mild worsening since previous study. Tiny extraluminal air bubbles are now seen in the adjacent sigmoid mesocolon, consistent with microperforation. No evidence of free intraperitoneal air, abscess, or free fluid. No evidence of bowel obstruction. Vascular/Lymphatic: No pathologically enlarged lymph nodes. No evidence of abdominal aortic aneurysm. Reproductive: No mass or other significant abnormality. Other: None. Musculoskeletal:  No suspicious bone lesions identified. IMPRESSION: Mild worsening of moderate to severe sigmoid diverticulitis, with microperforation. No evidence of free intraperitoneal air or abscess. Electronically Signed   By: Earle Gell M.D.   On: 02/27/2015 19:56   Ct Abdomen Pelvis W Contrast  02/05/2015   CLINICAL DATA:  Lower abdominal pain for 3 days. History of diverticulitis. EXAM: CT ABDOMEN AND PELVIS WITH CONTRAST TECHNIQUE: Multidetector CT imaging of the abdomen and pelvis was performed using the standard protocol following bolus administration of intravenous contrast. CONTRAST:  156mL OMNIPAQUE IOHEXOL 300  MG/ML  SOLN COMPARISON:  None. FINDINGS: Lower chest:  Subpleural 4 mm nodule in the right lower lobe. Liver: No focal lesion. Hepatobiliary: Gallbladder physiologically distended, question tiny depending gallstone. No pericholecystic inflammatory change. No biliary dilatation. Pancreas: No ductal dilatation or surrounding inflammation. Spleen: Normal. Adrenal glands: No nodule. Kidneys: Symmetric renal enhancement and excretion. No hydronephrosis. No perinephric stranding or focal renal abnormality. Stomach/Bowel: Small hiatal hernia. Stomach decompressed. Small duodenum diverticulum. No dilated or thickened small bowel loops. There is diverticulosis of the distal colon. Colonic wall thickening with significant pericolonic fat stranding is seen about the mid sigmoid colon in the mid pelvis in a pattern consistent with diverticulitis, however the inflammation appears to span a moderate length segment of colon. There is no perforation or abscess. The appendix is normal. Vascular/Lymphatic: Small lower retroperitoneal lymph nodes, not enlarged right criteria Abdominal aorta is normal in caliber. Reproductive: Prostate gland normal in size. Bladder: Minimally distended, mild bladder wall thickening is likely reactive. No intravesicular air. Other: No pneumoperitoneum or abscess.  No ascites. Musculoskeletal: There are no acute or suspicious osseous abnormalities. Os acetabuli on the left hip. Probable avascular necrosis of the left femoral head. Mild degenerative change in the spine. IMPRESSION: 1. Colonic wall thickening with pericolonic inflammatory change in the setting of multiple diverticula involving  the mid sigmoid colon. Findings favor acute diverticulitis, though the inflammation appears to span a moderate length of the sigmoid colon. Recommend follow-up colonoscopy after course of treatment to exclude underlying neoplastic process. No perforation or abscess. 2. Urinary bladder wall thickening is likely reactive. 3. Incidental finding of avascular necrosis of the left femoral head. 4. Subpleural 4 mm nodule in the right lower lobe. If the patient is at high risk for bronchogenic carcinoma, follow-up chest CT at 1 year is recommended. If the patient is at low risk, no follow-up is needed. This recommendation follows the consensus statement: Guidelines for Management of Small Pulmonary Nodules Detected on CT Scans: A Statement from the [place] as published in Radiology 2005; 237:395-400. Electronically Signed   By: Jeb Levering M.D.   On: 02/05/2015 06:14         Subjective: Patient continues to feel better. He states that abdominal pain is improving. He is passing flatus and had a bowel movement. Denies any fevers, chills, chest pain, shortness breath, nausea, vomiting, diarrhea, hematochezia, melena. No headaches or neck pain.  Objective: Filed Vitals:   03/01/15 1000 03/01/15 1810 03/01/15 2203 03/02/15 0607  BP: 134/74 128/74 143/76 118/59  Pulse: 86 98 99 91  Temp: 97.9 F (36.6 C) 99.2 F (37.3 C) 100 F (37.8 C) 99.1 F (37.3 C)  TempSrc: Oral Oral Oral Oral  Resp: 16 18 16 14   Height:      Weight:   91.984 kg (202 lb 12.6 oz)   SpO2: 98% 96% 98% 96%    Intake/Output Summary (Last 24 hours) at 03/02/15 1809 Last data filed at 03/02/15 0600  Gross per 24 hour  Intake      0 ml  Output      0 ml  Net      0 ml   Weight change: -0.116 kg (-4.1 oz) Exam:   General:  Pt is alert, follows commands appropriately, not in acute distress  HEENT: No icterus, No thrush, No neck mass, [employer]/AT  Cardiovascular: RRR, S1/S2, no rubs, no gallops  Respiratory: CTA  bilaterally, no wheezing, no crackles, no rhonchi  Abdomen: Soft/+BS,LLQ tender without rebound, non distended, no  guarding  Extremities: No edema, No lymphangitis, No petechiae, No rashes, no synovitis  Data Reviewed: Basic Metabolic Panel:  Recent Labs Lab 02/27/15 1505 02/28/15 0418 03/01/15 0635 03/02/15 0233  NA 132* 133* 134* 135  K 3.4* 3.6 3.9 4.4  CL 96* 96* 102 101  CO2 25 27 24 24   GLUCOSE 114* 116* 107* 105*  BUN 12 6 9 9   CREATININE 1.14 0.93 0.93 0.99  CALCIUM 9.5 8.9 9.0 8.9   Liver Function Tests:  Recent Labs Lab 02/27/15 1505 03/01/15 0635  AST 16 12*  ALT 16* 12*  ALKPHOS 57 46  BILITOT 0.6 0.8  PROT 7.6 7.2  ALBUMIN 3.6 2.9*    Recent Labs Lab 02/27/15 1505  LIPASE 25   No results for input(s): AMMONIA in the last 168 hours. CBC:  Recent Labs Lab 02/27/15 1505 02/28/15 0418 03/01/15 0635 03/02/15 0233  WBC 12.1* 13.1* 12.1* 11.9*  HGB 13.2 12.4* 12.5* 11.9*  HCT 40.4 38.6* 37.8* 36.4*  MCV 87.3 88.3 88.1 88.1  PLT 291 243 244 247   Cardiac Enzymes: No results for input(s): CKTOTAL, CKMB, CKMBINDEX, TROPONINI in the last 168 hours. BNP: Invalid input(s): POCBNP CBG: No results for input(s): GLUCAP in the last 168 hours.  Recent Results (from the past 240 hour(s))  Culture, blood (routine x 2)     Status: None (Preliminary result)   Collection Time: 02/28/15  6:34 AM  Result Value Ref Range Status   Specimen Description BLOOD LEFT ARM  Final   Special Requests BOTTLES DRAWN AEROBIC AND ANAEROBIC 10CC  Final   Culture NO GROWTH 2 DAYS  Final   Report Status PENDING  Incomplete  Culture, blood (routine x 2)     Status: None (Preliminary result)   Collection Time: 02/28/15  6:35 AM  Result Value Ref Range Status   Specimen Description BLOOD LEFT HAND  Final   Special Requests BOTTLES DRAWN AEROBIC AND ANAEROBIC 10CC  Final   Culture NO GROWTH 2 DAYS  Final   Report Status PENDING  Incomplete     Scheduled Meds: .  enoxaparin (LOVENOX) injection  40 mg Subcutaneous Q24H  . fenofibrate  160 mg Oral Daily  . folic acid  1 mg Oral Daily  . gabapentin  300 mg Oral QID  . lisinopril  10 mg Oral Daily  . loratadine  10 mg Oral Daily  . multivitamin with minerals  1 tablet Oral Daily  . nicotine  7 mg Transdermal Daily  . omega-3 acid ethyl esters  1 g Oral Daily  . pantoprazole  40 mg Oral Daily  . piperacillin-tazobactam (ZOSYN)  IV  3.375 g Intravenous 3 times per day  . sertraline  50 mg Oral Daily  . thiamine  100 mg Oral Daily   Continuous Infusions: . sodium chloride 0.9 % 1,000 mL with potassium chloride 20 mEq infusion 75 mL/hr at 03/02/15 1600     Ashraf Mesta, DO  [facility] Pager [phone]  If 7PM-7AM, please contact night-coverage [email or web address] Password [email or web address] 03/02/2015, 6:09 PM   LOS: 3 days

## 2015-03-03 DIAGNOSIS — E876 Hypokalemia: Secondary | ICD-10-CM

## 2015-03-03 LAB — BASIC METABOLIC PANEL
Anion gap: 10 (ref 5–15)
BUN: 10 mg/dL (ref 6–20)
CALCIUM: 8.9 mg/dL (ref 8.9–10.3)
CHLORIDE: 102 mmol/L (ref 101–111)
CO2: 24 mmol/L (ref 22–32)
CREATININE: 0.88 mg/dL (ref 0.61–1.24)
Glucose, Bld: 101 mg/dL — ABNORMAL HIGH (ref 65–99)
Potassium: 4.2 mmol/L (ref 3.5–5.1)
SODIUM: 136 mmol/L (ref 135–145)

## 2015-03-03 LAB — CBC
HCT: 33.8 % — ABNORMAL LOW (ref 39.0–52.0)
HEMOGLOBIN: 10.9 g/dL — AB (ref 13.0–17.0)
MCH: 28.3 pg (ref 26.0–34.0)
MCHC: 32.2 g/dL (ref 30.0–36.0)
MCV: 87.8 fL (ref 78.0–100.0)
PLATELETS: 222 10*3/uL (ref 150–400)
RBC: 3.85 MIL/uL — ABNORMAL LOW (ref 4.22–5.81)
RDW: 13.4 % (ref 11.5–15.5)
WBC: 9.3 10*3/uL (ref 4.0–10.5)

## 2015-03-03 NOTE — Progress Notes (Signed)
Subjective: Tolerating clears, had BM x 2, still a lot of pain with flatus  Objective: Vital signs in last 24 hours: Temp:  [98.4 F (36.9 C)-99.5 F (37.5 C)] 98.4 F (36.9 C) (12/10 0904) Pulse Rate:  [81-100] 82 (12/10 0904) Resp:  [15-18] 18 (12/10 0904) BP: (120-128)/(65-81) 124/75 mmHg (12/10 0904) SpO2:  [98 %] 98 % (12/10 0904) Last BM Date: 03/02/15  Intake/Output from previous day: 12/09 0701 - 12/10 0700 In: 780 [P.O.:780] Out: -  Intake/Output this shift: Total I/O In: 240 [P.O.:240] Out: -   General appearance: alert and cooperative Resp: clear to auscultation bilaterally Cardio: regular rate and rhythm GI: soft, quite tender LLQ without guarding  Lab Results:   Recent Labs  03/02/15 0233 03/03/15 0520  WBC 11.9* 9.3  HGB 11.9* 10.9*  HCT 36.4* 33.8*  PLT 247 222   BMET  Recent Labs  03/02/15 0233 03/03/15 0520  NA 135 136  K 4.4 4.2  CL 101 102  CO2 24 24  GLUCOSE 105* 101*  BUN 9 10  CREATININE 0.99 0.88  CALCIUM 8.9 8.9   PT/INR No results for input(s): LABPROT, INR in the last 72 hours. ABG No results for input(s): PHART, HCO3 in the last 72 hours.  Invalid input(s): PCO2, PO2  Studies/Results: No results found.  Anti-infectives: Anti-infectives    Start     Dose/Rate Route Frequency Ordered Stop   02/28/15 1400  piperacillin-tazobactam (ZOSYN) IVPB 3.375 g  Status:  Discontinued     3.375 g 100 mL/hr over 30 Minutes Intravenous 3 times per day 02/28/15 1055 02/28/15 1103   02/28/15 1400  piperacillin-tazobactam (ZOSYN) IVPB 3.375 g     3.375 g 12.5 mL/hr over 240 Minutes Intravenous 3 times per day 02/28/15 1104     02/28/15 1200  vancomycin (VANCOCIN) IVPB 1000 mg/200 mL premix  Status:  Discontinued     1,000 mg 200 mL/hr over 60 Minutes Intravenous Every 8 hours 02/28/15 0544 02/28/15 0848   02/28/15 1000  ciprofloxacin (CIPRO) IVPB 400 mg  Status:  Discontinued     400 mg 200 mL/hr over 60 Minutes Intravenous  Every 12 hours 02/27/15 2210 02/28/15 0533   02/28/15 0600  cefTAZidime (FORTAZ) 2 g in dextrose 5 % 50 mL IVPB  Status:  Discontinued     2 g 100 mL/hr over 30 Minutes Intravenous 3 times per day 02/28/15 0533 02/28/15 1055   02/28/15 0545  vancomycin (VANCOCIN) IVPB 1000 mg/200 mL premix  Status:  Discontinued     1,000 mg 200 mL/hr over 60 Minutes Intravenous  Once 02/28/15 0533 02/28/15 0542   02/28/15 0545  vancomycin (VANCOCIN) IVPB 1000 mg/200 mL premix     1,000 mg 200 mL/hr over 60 Minutes Intravenous  Once 02/28/15 0544 02/28/15 0714   02/28/15 0430  metroNIDAZOLE (FLAGYL) IVPB 500 mg  Status:  Discontinued     500 mg 100 mL/hr over 60 Minutes Intravenous Every 8 hours 02/27/15 2213 02/28/15 1055   02/27/15 2230  metroNIDAZOLE (FLAGYL) IVPB 500 mg  Status:  Discontinued     500 mg 100 mL/hr over 60 Minutes Intravenous Every 8 hours 02/27/15 2210 02/27/15 2213   02/27/15 2100  metroNIDAZOLE (FLAGYL) IVPB 1 g     1 g 200 mL/hr over 60 Minutes Intravenous  Once 02/27/15 2006 02/27/15 2128   02/27/15 2015  ciprofloxacin (CIPRO) IVPB 400 mg     400 mg 200 mL/hr over 60 Minutes Intravenous  Once 02/27/15 2006 02/27/15 2128  Assessment/Plan: Diverticulitis with microperforation - WBC better but still quite tender. Continue clears today, continue Zosyn.  LOS: 4 days    Alegandro Macnaughton E 03/03/2015

## 2015-03-03 NOTE — Progress Notes (Signed)
PROGRESS NOTE  Russell Adams [id number] DOB: June 09, 1970 DOA: 02/27/2015 PCP: Leamon Arnt, MD  Brief History 44 year old male with a history of hypertension, hyperlipidemia, diverticulitis, depression, PTSD, tobacco and alcohol dependence presented with 2 day history of right lower quadrant abdominal pain that radiates to the left lower quadrant. He has had some nausea without any vomiting. He has had some loose stools without any hematochezia or melena. He does complain of some rectal pain and abdominal pain with bowel movements. The patient was treated with Cipro and Flagyl after an emergency department visit on 02/05/2015 for diverticulitis. CT of the abdomen and pelvis on 02/05/2015 revealed sigmoid diverticulitis. Repeat CT abdomen and pelvis 02/27/2015 showed mild worsening with microperforation. The patient was admitted for intravenous fluids and intravenous antibiotics. General surgery was consulted. Assessment/Plan: Acute diverticulitis with microperforation -Patient claims that he took Cipro and Flagyl, but doubt that he was compliant as he drank alcohol at the end of the abx regimen with concomitant disulfiram effect from the patient's metronidazole -02/27/2015 CT abdomen and pelvis--moderate to severe sigmoid diverticulitis with mild worsening and microperforation -Consulted general surgery as this is the patient's third episode in the past 3-4 years -General surgery input appreciated -03/02/15--started clear liquids -Continue IV fluids -Continue opioids for pain control -Continue intravenous antibiotics -Discontinue vancomycin; continue zosyn D#4 -Follow up blood cultures--NGTD -WBC improving and pain is improving overall Avascular necrosis--left femoral head -Incidental finding on CT -Patient is asymptomatic  -Will need outpatient orthopedics follow-up  Hypertension  -controlled  -Continue lisinopril  Hyponatremia  -Secondary to HCTZ and volume  depletion  -Improving with IV fluids Dyslipidemia  -Continue fenofibrate and Lovaza  PTSD history  -Continue Zoloft  Alcohol dependence  -Drinks 6 pack beer per day  -CIWA protocol -last drink 02/26/15 GERD -continue PPI Tobacco abuse/Cannibis -Tobacco cessation discussed -NicoDerm patch   Family Communication: Pt at beside Disposition Plan: Home 1-2 days when cleared by surgery    Procedures/Studies: Ct Abdomen Pelvis W Contrast  02/27/2015  CLINICAL DATA:  Right lower quadrant pain for several days. Nausea. Diarrhea. Diverticulitis. EXAM: CT ABDOMEN AND PELVIS WITH CONTRAST TECHNIQUE: Multidetector CT imaging of the abdomen and pelvis was performed using the standard protocol following bolus administration of intravenous contrast. CONTRAST:  172mL OMNIPAQUE IOHEXOL 300 MG/ML  SOLN COMPARISON:  02/05/2015 FINDINGS: Lower chest:  No acute findings. Hepatobiliary: No masses or other significant abnormality. Pancreas: No mass, inflammatory changes, or other significant abnormality. Spleen: Within normal limits in size and appearance. Adrenals/Urinary Tract: No masses identified. No evidence of hydronephrosis. Stomach/Bowel: Moderate to severe sigmoid diverticulitis shows mild worsening since previous study. Tiny extraluminal air bubbles are now seen in the adjacent sigmoid mesocolon, consistent with microperforation. No evidence of free intraperitoneal air, abscess, or free fluid. No evidence of bowel obstruction. Vascular/Lymphatic: No pathologically enlarged lymph nodes. No evidence of abdominal aortic aneurysm. Reproductive: No mass or other significant abnormality. Other: None. Musculoskeletal:  No suspicious bone lesions identified. IMPRESSION: Mild worsening of moderate to severe sigmoid diverticulitis, with microperforation. No evidence of free intraperitoneal air or abscess. Electronically Signed   By: Earle Gell M.D.   On: 02/27/2015 19:56   Ct Abdomen Pelvis W  Contrast  02/05/2015  CLINICAL DATA:  Lower abdominal pain for 3 days. History of diverticulitis. EXAM: CT ABDOMEN AND PELVIS WITH CONTRAST TECHNIQUE: Multidetector CT imaging of the abdomen and pelvis was performed using the standard protocol following bolus administration of intravenous contrast. CONTRAST:  162mL OMNIPAQUE IOHEXOL 300 MG/ML  SOLN COMPARISON:  None. FINDINGS: Lower chest:  Subpleural 4 mm nodule in the right lower lobe. Liver: No focal lesion. Hepatobiliary: Gallbladder physiologically distended, question tiny depending gallstone. No pericholecystic inflammatory change. No biliary dilatation. Pancreas: No ductal dilatation or surrounding inflammation. Spleen: Normal. Adrenal glands: No nodule. Kidneys: Symmetric renal enhancement and excretion. No hydronephrosis. No perinephric stranding or focal renal abnormality. Stomach/Bowel: Small hiatal hernia. Stomach decompressed. Small duodenum diverticulum. No dilated or thickened small bowel loops. There is diverticulosis of the distal colon. Colonic wall thickening with significant pericolonic fat stranding is seen about the mid sigmoid colon in the mid pelvis in a pattern consistent with diverticulitis, however the inflammation appears to span a moderate length segment of colon. There is no perforation or abscess. The appendix is normal. Vascular/Lymphatic: Small lower retroperitoneal lymph nodes, not enlarged right criteria Abdominal aorta is normal in caliber. Reproductive: Prostate gland normal in size. Bladder: Minimally distended, mild bladder wall thickening is likely reactive. No intravesicular air. Other: No pneumoperitoneum or abscess.  No ascites. Musculoskeletal: There are no acute or suspicious osseous abnormalities. Os acetabuli on the left hip. Probable avascular necrosis of the left femoral head. Mild degenerative change in the spine. IMPRESSION: 1. Colonic wall thickening with pericolonic inflammatory change in the setting of multiple  diverticula involving the mid sigmoid colon. Findings favor acute diverticulitis, though the inflammation appears to span a moderate length of the sigmoid colon. Recommend follow-up colonoscopy after course of treatment to exclude underlying neoplastic process. No perforation or abscess. 2. Urinary bladder wall thickening is likely reactive. 3. Incidental finding of avascular necrosis of the left femoral head. 4. Subpleural 4 mm nodule in the right lower lobe. If the patient is at high risk for bronchogenic carcinoma, follow-up chest CT at 1 year is recommended. If the patient is at low risk, no follow-up is needed. This recommendation follows the consensus statement: Guidelines for Management of Small Pulmonary Nodules Detected on CT Scans: A Statement from the [place] as published in Radiology 2005; 237:395-400. Electronically Signed   By: Jeb Levering M.D.   On: 02/05/2015 06:14         Subjective:  Patient appears to be tolerating clear liquids. Abdominal pain overall is improved. Denies any fevers, chills, chest pain, short of breath, vomiting, diarrhea. He had a bowel movement today.  Objective: Filed Vitals:   03/02/15 1121 03/02/15 2106 03/03/15 0508 03/03/15 0904  BP: 120/65 128/81 128/80 124/75  Pulse: 100 81 81 82  Temp: 98.8 F (37.1 C) 99.5 F (37.5 C) 98.8 F (37.1 C) 98.4 F (36.9 C)  TempSrc: Oral   Oral  Resp: 15 18 18 18   Height:      Weight:      SpO2: 98% 98% 98% 98%    Intake/Output Summary (Last 24 hours) at 03/03/15 1751 Last data filed at 03/03/15 0909  Gross per 24 hour  Intake    600 ml  Output      0 ml  Net    600 ml   Weight change:  Exam:   General:  Pt is alert, follows commands appropriately, not in acute distress  HEENT: No icterus, No thrush, No neck mass, [place]/AT  Cardiovascular: RRR, S1/S2, no rubs, no gallops  Respiratory: CTA bilaterally, no wheezing, no crackles, no rhonchi  Abdomen: Soft/+BS, mild LLQ tender, non  distended, no guarding  Extremities: No edema, No lymphangitis, No petechiae, No rashes, no synovitis  Data Reviewed: Basic  Metabolic Panel:  Recent Labs Lab 02/27/15 1505 02/28/15 0418 03/01/15 0635 03/02/15 0233 03/03/15 0520  NA 132* 133* 134* 135 136  K 3.4* 3.6 3.9 4.4 4.2  CL 96* 96* 102 101 102  CO2 25 27 24 24 24   GLUCOSE 114* 116* 107* 105* 101*  BUN 12 6 9 9 10   CREATININE 1.14 0.93 0.93 0.99 0.88  CALCIUM 9.5 8.9 9.0 8.9 8.9   Liver Function Tests:  Recent Labs Lab 02/27/15 1505 03/01/15 0635  AST 16 12*  ALT 16* 12*  ALKPHOS 57 46  BILITOT 0.6 0.8  PROT 7.6 7.2  ALBUMIN 3.6 2.9*    Recent Labs Lab 02/27/15 1505  LIPASE 25   No results for input(s): AMMONIA in the last 168 hours. CBC:  Recent Labs Lab 02/27/15 1505 02/28/15 0418 03/01/15 0635 03/02/15 0233 03/03/15 0520  WBC 12.1* 13.1* 12.1* 11.9* 9.3  HGB 13.2 12.4* 12.5* 11.9* 10.9*  HCT 40.4 38.6* 37.8* 36.4* 33.8*  MCV 87.3 88.3 88.1 88.1 87.8  PLT 291 243 244 247 222   Cardiac Enzymes: No results for input(s): CKTOTAL, CKMB, CKMBINDEX, TROPONINI in the last 168 hours. BNP: Invalid input(s): POCBNP CBG: No results for input(s): GLUCAP in the last 168 hours.  Recent Results (from the past 240 hour(s))  Culture, blood (routine x 2)     Status: None (Preliminary result)   Collection Time: 02/28/15  6:34 AM  Result Value Ref Range Status   Specimen Description BLOOD LEFT ARM  Final   Special Requests BOTTLES DRAWN AEROBIC AND ANAEROBIC 10CC  Final   Culture NO GROWTH 3 DAYS  Final   Report Status PENDING  Incomplete  Culture, blood (routine x 2)     Status: None (Preliminary result)   Collection Time: 02/28/15  6:35 AM  Result Value Ref Range Status   Specimen Description BLOOD LEFT HAND  Final   Special Requests BOTTLES DRAWN AEROBIC AND ANAEROBIC 10CC  Final   Culture NO GROWTH 3 DAYS  Final   Report Status PENDING  Incomplete     Scheduled Meds: . enoxaparin (LOVENOX)  injection  40 mg Subcutaneous Q24H  . fenofibrate  160 mg Oral Daily  . folic acid  1 mg Oral Daily  . gabapentin  300 mg Oral QID  . lisinopril  10 mg Oral Daily  . loratadine  10 mg Oral Daily  . multivitamin with minerals  1 tablet Oral Daily  . nicotine  7 mg Transdermal Daily  . omega-3 acid ethyl esters  1 g Oral Daily  . pantoprazole  40 mg Oral Daily  . piperacillin-tazobactam (ZOSYN)  IV  3.375 g Intravenous 3 times per day  . sertraline  50 mg Oral Daily  . thiamine  100 mg Oral Daily   Continuous Infusions: . sodium chloride 0.9 % 1,000 mL with potassium chloride 20 mEq infusion 75 mL/hr at 03/03/15 1750     Nashonda Limberg, DO  [facility] Pager [phone]  If 7PM-7AM, please contact night-coverage [email or web address] Password [email or web address] 03/03/2015, 5:51 PM   LOS: 4 days

## 2015-03-04 LAB — BASIC METABOLIC PANEL
ANION GAP: 8 (ref 5–15)
BUN: 8 mg/dL (ref 6–20)
CHLORIDE: 103 mmol/L (ref 101–111)
CO2: 26 mmol/L (ref 22–32)
Calcium: 9.1 mg/dL (ref 8.9–10.3)
Creatinine, Ser: 0.97 mg/dL (ref 0.61–1.24)
GFR calc non Af Amer: >60 mL/min (ref >60)
Glucose, Bld: 106 mg/dL — ABNORMAL HIGH (ref 65–99)
Potassium: 4.3 mmol/L (ref 3.5–5.1)
Sodium: 137 mmol/L (ref 135–145)

## 2015-03-04 LAB — CBC
HEMATOCRIT: 36.4 % — AB (ref 39.0–52.0)
HEMOGLOBIN: 11.9 g/dL — AB (ref 13.0–17.0)
MCH: 28.7 pg (ref 26.0–34.0)
MCHC: 32.7 g/dL (ref 30.0–36.0)
MCV: 87.7 fL (ref 78.0–100.0)
Platelets: 246 10*3/uL (ref 150–400)
RBC: 4.15 MIL/uL — AB (ref 4.22–5.81)
RDW: 13.3 % (ref 11.5–15.5)
WBC: 9.1 10*3/uL (ref 4.0–10.5)

## 2015-03-04 NOTE — Progress Notes (Signed)
PROGRESS NOTE  Russell Adams [id number] DOB: 05-11-1970 DOA: 02/27/2015 PCP: Leamon Arnt, MD  Brief History 44 year old male with a history of hypertension, hyperlipidemia, diverticulitis, depression, PTSD, tobacco and alcohol dependence presented with 2 day history of right lower quadrant abdominal pain that radiates to the left lower quadrant. He has had some nausea without any vomiting. He has had some loose stools without any hematochezia or melena. He does complain of some rectal pain and abdominal pain with bowel movements. The patient was treated with Cipro and Flagyl after an emergency department visit on 02/05/2015 for diverticulitis. CT of the abdomen and pelvis on 02/05/2015 revealed sigmoid diverticulitis. Repeat CT abdomen and pelvis 02/27/2015 showed mild worsening with microperforation. The patient was admitted for intravenous fluids and intravenous antibiotics. General surgery was consulted. Assessment/Plan: Acute diverticulitis with microperforation -Patient claims that he took Cipro and Flagyl, but doubt that he was compliant as he drank alcohol at the end of the abx regimen with concomitant disulfiram effect from the patient's metronidazole -02/27/2015 CT abdomen and pelvis--moderate to severe sigmoid diverticulitis with mild worsening and microperforation -Consulted general surgery as this is the patient's third episode in the past 3-4 years -General surgery input appreciated -03/02/15--started clear liquids -03/04/15--full liquids -Continue IV fluids -Continue opioids for pain control -Continue intravenous antibiotics -Discontinue vancomycin; continue zosyn D#5 -Follow up blood cultures--NGTD -WBC improving and pain is improving overall Avascular necrosis--left femoral head -Incidental finding on CT -Patient is asymptomatic  -Will need outpatient orthopedics follow-up  Hypertension  -controlled  -Continue lisinopril  Hyponatremia  -Secondary to  HCTZ and volume depletion  -Improving with IV fluids Dyslipidemia  -Continue fenofibrate and Lovaza  PTSD history  -Continue Zoloft  Alcohol dependence  -Drinks 6 pack beer per day  -CIWA protocol -last drink 02/26/15 GERD -continue PPI Tobacco abuse/Cannibis -Tobacco cessation discussed -NicoDerm patch   Family Communication: Pt at beside Disposition Plan: Home 1-2 days when cleared by surgery    Procedures/Studies: Ct Abdomen Pelvis W Contrast  02/27/2015  CLINICAL DATA:  Right lower quadrant pain for several days. Nausea. Diarrhea. Diverticulitis. EXAM: CT ABDOMEN AND PELVIS WITH CONTRAST TECHNIQUE: Multidetector CT imaging of the abdomen and pelvis was performed using the standard protocol following bolus administration of intravenous contrast. CONTRAST:  17mL OMNIPAQUE IOHEXOL 300 MG/ML  SOLN COMPARISON:  02/05/2015 FINDINGS: Lower chest:  No acute findings. Hepatobiliary: No masses or other significant abnormality. Pancreas: No mass, inflammatory changes, or other significant abnormality. Spleen: Within normal limits in size and appearance. Adrenals/Urinary Tract: No masses identified. No evidence of hydronephrosis. Stomach/Bowel: Moderate to severe sigmoid diverticulitis shows mild worsening since previous study. Tiny extraluminal air bubbles are now seen in the adjacent sigmoid mesocolon, consistent with microperforation. No evidence of free intraperitoneal air, abscess, or free fluid. No evidence of bowel obstruction. Vascular/Lymphatic: No pathologically enlarged lymph nodes. No evidence of abdominal aortic aneurysm. Reproductive: No mass or other significant abnormality. Other: None. Musculoskeletal:  No suspicious bone lesions identified. IMPRESSION: Mild worsening of moderate to severe sigmoid diverticulitis, with microperforation. No evidence of free intraperitoneal air or abscess. Electronically Signed   By: Earle Gell M.D.   On: 02/27/2015 19:56   Ct Abdomen Pelvis  W Contrast  02/05/2015  CLINICAL DATA:  Lower abdominal pain for 3 days. History of diverticulitis. EXAM: CT ABDOMEN AND PELVIS WITH CONTRAST TECHNIQUE: Multidetector CT imaging of the abdomen and pelvis was performed using the standard protocol following bolus administration of intravenous contrast.  CONTRAST:  139mL OMNIPAQUE IOHEXOL 300 MG/ML  SOLN COMPARISON:  None. FINDINGS: Lower chest:  Subpleural 4 mm nodule in the right lower lobe. Liver: No focal lesion. Hepatobiliary: Gallbladder physiologically distended, question tiny depending gallstone. No pericholecystic inflammatory change. No biliary dilatation. Pancreas: No ductal dilatation or surrounding inflammation. Spleen: Normal. Adrenal glands: No nodule. Kidneys: Symmetric renal enhancement and excretion. No hydronephrosis. No perinephric stranding or focal renal abnormality. Stomach/Bowel: Small hiatal hernia. Stomach decompressed. Small duodenum diverticulum. No dilated or thickened small bowel loops. There is diverticulosis of the distal colon. Colonic wall thickening with significant pericolonic fat stranding is seen about the mid sigmoid colon in the mid pelvis in a pattern consistent with diverticulitis, however the inflammation appears to span a moderate length segment of colon. There is no perforation or abscess. The appendix is normal. Vascular/Lymphatic: Small lower retroperitoneal lymph nodes, not enlarged right criteria Abdominal aorta is normal in caliber. Reproductive: Prostate gland normal in size. Bladder: Minimally distended, mild bladder wall thickening is likely reactive. No intravesicular air. Other: No pneumoperitoneum or abscess.  No ascites. Musculoskeletal: There are no acute or suspicious osseous abnormalities. Os acetabuli on the left hip. Probable avascular necrosis of the left femoral head. Mild degenerative change in the spine. IMPRESSION: 1. Colonic wall thickening with pericolonic inflammatory change in the setting of  multiple diverticula involving the mid sigmoid colon. Findings favor acute diverticulitis, though the inflammation appears to span a moderate length of the sigmoid colon. Recommend follow-up colonoscopy after course of treatment to exclude underlying neoplastic process. No perforation or abscess. 2. Urinary bladder wall thickening is likely reactive. 3. Incidental finding of avascular necrosis of the left femoral head. 4. Subpleural 4 mm nodule in the right lower lobe. If the patient is at high risk for bronchogenic carcinoma, follow-up chest CT at 1 year is recommended. If the patient is at low risk, no follow-up is needed. This recommendation follows the consensus statement: Guidelines for Management of Small Pulmonary Nodules Detected on CT Scans: A Statement from the [place] as published in Radiology 2005; 237:395-400. Electronically Signed   By: Jeb Levering M.D.   On: 02/05/2015 06:14        Subjective: Overall abdominal pain is improving. He is tolerating full liquids. Denies any fevers, chills, chest pain or shortness breath, nausea, vomiting, diarrhea, abdominal pain.  Objective: Filed Vitals:   03/03/15 0904 03/03/15 1801 03/03/15 2203 03/04/15 1253  BP: 124/75 110/68 123/80 128/72  Pulse: 82 80 82 88  Temp: 98.4 F (36.9 C) 98.2 F (36.8 C) 98.7 F (37.1 C) 97.4 F (36.3 C)  TempSrc: Oral Oral Oral Oral  Resp: 18 18 17 18   Height:      Weight:      SpO2: 98% 98% 99% 98%    Intake/Output Summary (Last 24 hours) at 03/04/15 1645 Last data filed at 03/04/15 1254  Gross per 24 hour  Intake    240 ml  Output    200 ml  Net     40 ml   Weight change:  Exam:   General:  Pt is alert, follows commands appropriately, not in acute distress  HEENT: No icterus, No thrush, No neck mass, [place]/AT  Cardiovascular: RRR, S1/S2, no rubs, no gallops  Respiratory: CTA bilaterally, no wheezing, no crackles, no rhonchi  Abdomen: Soft/+BS, minimal LLQ tender without  rebound, non distended, no guarding  Extremities: No edema, No lymphangitis, No petechiae, No rashes, no synovitis  Data Reviewed: Basic Metabolic Panel:  Recent  Labs Lab 02/28/15 0418 03/01/15 0635 03/02/15 0233 03/03/15 0520 03/04/15 0415  NA 133* 134* 135 136 137  K 3.6 3.9 4.4 4.2 4.3  CL 96* 102 101 102 103  CO2 27 24 24 24 26   GLUCOSE 116* 107* 105* 101* 106*  BUN 6 9 9 10 8   CREATININE 0.93 0.93 0.99 0.88 0.97  CALCIUM 8.9 9.0 8.9 8.9 9.1   Liver Function Tests:  Recent Labs Lab 02/27/15 1505 03/01/15 0635  AST 16 12*  ALT 16* 12*  ALKPHOS 57 46  BILITOT 0.6 0.8  PROT 7.6 7.2  ALBUMIN 3.6 2.9*    Recent Labs Lab 02/27/15 1505  LIPASE 25   No results for input(s): AMMONIA in the last 168 hours. CBC:  Recent Labs Lab 02/28/15 0418 03/01/15 0635 03/02/15 0233 03/03/15 0520 03/04/15 0415  WBC 13.1* 12.1* 11.9* 9.3 9.1  HGB 12.4* 12.5* 11.9* 10.9* 11.9*  HCT 38.6* 37.8* 36.4* 33.8* 36.4*  MCV 88.3 88.1 88.1 87.8 87.7  PLT 243 244 247 222 246   Cardiac Enzymes: No results for input(s): CKTOTAL, CKMB, CKMBINDEX, TROPONINI in the last 168 hours. BNP: Invalid input(s): POCBNP CBG: No results for input(s): GLUCAP in the last 168 hours.  Recent Results (from the past 240 hour(s))  Culture, blood (routine x 2)     Status: None (Preliminary result)   Collection Time: 02/28/15  6:34 AM  Result Value Ref Range Status   Specimen Description BLOOD LEFT ARM  Final   Special Requests BOTTLES DRAWN AEROBIC AND ANAEROBIC 10CC  Final   Culture NO GROWTH 4 DAYS  Final   Report Status PENDING  Incomplete  Culture, blood (routine x 2)     Status: None (Preliminary result)   Collection Time: 02/28/15  6:35 AM  Result Value Ref Range Status   Specimen Description BLOOD LEFT HAND  Final   Special Requests BOTTLES DRAWN AEROBIC AND ANAEROBIC 10CC  Final   Culture NO GROWTH 4 DAYS  Final   Report Status PENDING  Incomplete     Scheduled Meds: . enoxaparin  (LOVENOX) injection  40 mg Subcutaneous Q24H  . fenofibrate  160 mg Oral Daily  . folic acid  1 mg Oral Daily  . gabapentin  300 mg Oral QID  . lisinopril  10 mg Oral Daily  . loratadine  10 mg Oral Daily  . multivitamin with minerals  1 tablet Oral Daily  . nicotine  7 mg Transdermal Daily  . omega-3 acid ethyl esters  1 g Oral Daily  . pantoprazole  40 mg Oral Daily  . piperacillin-tazobactam (ZOSYN)  IV  3.375 g Intravenous 3 times per day  . sertraline  50 mg Oral Daily  . thiamine  100 mg Oral Daily   Continuous Infusions: . sodium chloride 0.9 % 1,000 mL with potassium chloride 20 mEq infusion 75 mL/hr at 03/04/15 0934     Thayer Inabinet, DO  [facility] Pager [phone]  If 7PM-7AM, please contact night-coverage [email or web address] Password [email or web address] 03/04/2015, 4:45 PM   LOS: 5 days

## 2015-03-04 NOTE — Discharge Summary (Signed)
Physician Discharge Summary  KEREL ZEIDERS [id number] DOB: [birth year] DOA: 02/27/2015  PCP: Leamon Arnt, MD  Admit date: 02/27/2015 Discharge date: 03/05/15 Recommendations for Outpatient Follow-up:  1. Pt will need to follow up with PCP in 2 weeks post discharge 2. Please obtain BMP in one week Discharge Diagnoses:  Acute diverticulitis with microperforation -Patient claims that he took Cipro and Flagyl, but doubt that he was compliant as he drank alcohol at the end of the abx regimen with concomitant disulfiram effect from the patient's metronidazole -02/27/2015 CT abdomen and pelvis--moderate to severe sigmoid diverticulitis with mild worsening and microperforation -Consulted general surgery as this is the patient's third episode in the past 3-4 years -General surgery input appreciated -03/02/15--started clear liquids -03/04/15--full liquids-->advanced to soft diet which pt tolerated -Continue IV fluids--saline locked once pt had reliable po intake -Continue opioids for pain control--Oxy IR 5mg , #10, no RF, 1 po q 4 hours prn pain -Continue intravenous antibiotics while in hospital -Discontinue vancomycin; continue zosyn D#6 -home with cipro and flagyl x 9 more days to complete 14 days of therapy -Follow up blood cultures--NGTD -WBC improving and pain is improving overall Avascular necrosis--left femoral head -Incidental finding on CT -Patient is asymptomatic  -Will need outpatient orthopedics follow-up  Hypertension  -controlled  -Continue lisinopril  Hyponatremia  -Secondary to HCTZ and volume depletion  -Improving with IV fluids -will not restart as pt's BP is controlled and pt had hyponatremia Dyslipidemia  -Continue fenofibrate and Lovaza  PTSD history  -Continue Zoloft  Alcohol dependence  -Drinks 6 pack beer per day  -CIWA protocol -last drink 02/26/15 -no signs of withdraw GERD -continue PPI Tobacco abuse/Cannabis -Tobacco cessation  discussed -NicoDerm patch  Discharge Condition: stable  Disposition: home  Diet:soft Wt Readings from Last 3 Encounters:  03/01/15 91.984 kg (202 lb 12.6 oz)  02/05/15 90.719 kg (200 lb)  05/26/14 95.709 kg (211 lb)    History of present illness:  44 year old male with a history of hypertension, hyperlipidemia, diverticulitis, depression, PTSD, tobacco and alcohol dependence presented with 2 day history of right lower quadrant abdominal pain that radiates to the left lower quadrant. He has had some nausea without any vomiting. He has had some loose stools without any hematochezia or melena. He does complain of some rectal pain and abdominal pain with bowel movements. The patient was treated with Cipro and Flagyl after an emergency department visit on 02/05/2015 for diverticulitis. CT of the abdomen and pelvis on 02/05/2015 revealed sigmoid diverticulitis. Repeat CT abdomen and pelvis 02/27/2015 showed mild worsening with microperforation. The patient was admitted for intravenous fluids and intravenous antibiotics. General surgery was consulted. The patient was placed on bowel rest and made nothing by mouth. He slowly improved with decreasing abdominal pain and decreasing WBC count. His diet was slowly advanced. He remained afebrile and hemodynamically stable. His abdominal pain continued to improve, and his diet was advanced which she tolerated. The patient will be discharged home with ciprofloxacin and Flagyl  Consultants: General Surgery  Discharge Exam: Filed Vitals:   03/05/15 0533 03/05/15 1009  BP: 104/70 114/75  Pulse: 70 79  Temp:  97.7 F (36.5 C)  Resp:  18   Filed Vitals:   03/04/15 2032 03/05/15 0530 03/05/15 0533 03/05/15 1009  BP: 111/59 96/57 104/70 114/75  Pulse: 78 71 70 79  Temp: 98.1 F (36.7 C) 98 F (36.7 C)  97.7 F (36.5 C)  TempSrc: Oral Oral  Oral  Resp: 18 18  18   Height:  Weight:      SpO2: 97% 99%  99%   General: A&O x 3, NAD, pleasant,  cooperative Cardiovascular: RRR, no rub, no gallop, no S3 Respiratory: CTAB, no wheeze, no rhonchi Abdomen:soft, nontender, nondistended, positive bowel sounds Extremities: No edema, No lymphangitis, no petechiae  Discharge Instructions     Medication List    STOP taking these medications        lisinopril-hydrochlorothiazide 10-12.5 MG tablet  Commonly known as:  PRINZIDE,ZESTORETIC      TAKE these medications        acetaminophen 500 MG tablet  Commonly known as:  TYLENOL  Take 1,000 mg by mouth every 6 (six) hours as needed for mild pain.     cetirizine 10 MG tablet  Commonly known as:  ZYRTEC  Take 10 mg by mouth daily.     ciprofloxacin 500 MG tablet  Commonly known as:  CIPRO  Take 1 tablet (500 mg total) by mouth 2 (two) times daily.     clonazePAM 1 MG tablet  Commonly known as:  KLONOPIN  Take 1 mg by mouth 2 (two) times daily as needed for anxiety.     fenofibrate 145 MG tablet  Commonly known as:  TRICOR  Take 145 mg by mouth daily.     gabapentin 300 MG capsule  Commonly known as:  NEURONTIN  Take 300 mg by mouth 4 (four) times daily.     lisinopril 10 MG tablet  Commonly known as:  PRINIVIL,ZESTRIL  Take 1 tablet (10 mg total) by mouth daily.     metroNIDAZOLE 500 MG tablet  Commonly known as:  FLAGYL  Take 1 tablet (500 mg total) by mouth 3 (three) times daily.     omega-3 acid ethyl esters 1 G capsule  Commonly known as:  LOVAZA  Take 1 g by mouth daily.     omeprazole 20 MG capsule  Commonly known as:  PRILOSEC  Take 20 mg by mouth 2 (two) times daily before a meal.     oxyCODONE 5 MG immediate release tablet  Commonly known as:  Oxy IR/ROXICODONE  Take 1 tablet (5 mg total) by mouth every 4 (four) hours as needed for moderate pain.     sertraline 50 MG tablet  Commonly known as:  ZOLOFT  Take 50 mg by mouth daily.     zolpidem 5 MG tablet  Commonly known as:  AMBIEN  Take 10 mg by mouth at bedtime as needed for sleep.          The results of significant diagnostics from this hospitalization (including imaging, microbiology, ancillary and laboratory) are listed below for reference.    Significant Diagnostic Studies: Ct Abdomen Pelvis W Contrast  02/27/2015  CLINICAL DATA:  Right lower quadrant pain for several days. Nausea. Diarrhea. Diverticulitis. EXAM: CT ABDOMEN AND PELVIS WITH CONTRAST TECHNIQUE: Multidetector CT imaging of the abdomen and pelvis was performed using the standard protocol following bolus administration of intravenous contrast. CONTRAST:  189mL OMNIPAQUE IOHEXOL 300 MG/ML  SOLN COMPARISON:  02/05/2015 FINDINGS: Lower chest:  No acute findings. Hepatobiliary: No masses or other significant abnormality. Pancreas: No mass, inflammatory changes, or other significant abnormality. Spleen: Within normal limits in size and appearance. Adrenals/Urinary Tract: No masses identified. No evidence of hydronephrosis. Stomach/Bowel: Moderate to severe sigmoid diverticulitis shows mild worsening since previous study. Tiny extraluminal air bubbles are now seen in the adjacent sigmoid mesocolon, consistent with microperforation. No evidence of free intraperitoneal air, abscess, or free fluid. No evidence  of bowel obstruction. Vascular/Lymphatic: No pathologically enlarged lymph nodes. No evidence of abdominal aortic aneurysm. Reproductive: No mass or other significant abnormality. Other: None. Musculoskeletal:  No suspicious bone lesions identified. IMPRESSION: Mild worsening of moderate to severe sigmoid diverticulitis, with microperforation. No evidence of free intraperitoneal air or abscess. Electronically Signed   By: Earle Gell M.D.   On: 02/27/2015 19:56   Ct Abdomen Pelvis W Contrast  02/05/2015  CLINICAL DATA:  Lower abdominal pain for 3 days. History of diverticulitis. EXAM: CT ABDOMEN AND PELVIS WITH CONTRAST TECHNIQUE: Multidetector CT imaging of the abdomen and pelvis was performed using the standard protocol  following bolus administration of intravenous contrast. CONTRAST:  153mL OMNIPAQUE IOHEXOL 300 MG/ML  SOLN COMPARISON:  None. FINDINGS: Lower chest:  Subpleural 4 mm nodule in the right lower lobe. Liver: No focal lesion. Hepatobiliary: Gallbladder physiologically distended, question tiny depending gallstone. No pericholecystic inflammatory change. No biliary dilatation. Pancreas: No ductal dilatation or surrounding inflammation. Spleen: Normal. Adrenal glands: No nodule. Kidneys: Symmetric renal enhancement and excretion. No hydronephrosis. No perinephric stranding or focal renal abnormality. Stomach/Bowel: Small hiatal hernia. Stomach decompressed. Small duodenum diverticulum. No dilated or thickened small bowel loops. There is diverticulosis of the distal colon. Colonic wall thickening with significant pericolonic fat stranding is seen about the mid sigmoid colon in the mid pelvis in a pattern consistent with diverticulitis, however the inflammation appears to span a moderate length segment of colon. There is no perforation or abscess. The appendix is normal. Vascular/Lymphatic: Small lower retroperitoneal lymph nodes, not enlarged right criteria Abdominal aorta is normal in caliber. Reproductive: Prostate gland normal in size. Bladder: Minimally distended, mild bladder wall thickening is likely reactive. No intravesicular air. Other: No pneumoperitoneum or abscess.  No ascites. Musculoskeletal: There are no acute or suspicious osseous abnormalities. Os acetabuli on the left hip. Probable avascular necrosis of the left femoral head. Mild degenerative change in the spine. IMPRESSION: 1. Colonic wall thickening with pericolonic inflammatory change in the setting of multiple diverticula involving the mid sigmoid colon. Findings favor acute diverticulitis, though the inflammation appears to span a moderate length of the sigmoid colon. Recommend follow-up colonoscopy after course of treatment to exclude underlying  neoplastic process. No perforation or abscess. 2. Urinary bladder wall thickening is likely reactive. 3. Incidental finding of avascular necrosis of the left femoral head. 4. Subpleural 4 mm nodule in the right lower lobe. If the patient is at high risk for bronchogenic carcinoma, follow-up chest CT at 1 year is recommended. If the patient is at low risk, no follow-up is needed. This recommendation follows the consensus statement: Guidelines for Management of Small Pulmonary Nodules Detected on CT Scans: A Statement from the [place] as published in Radiology 2005; 237:395-400. Electronically Signed   By: Jeb Levering M.D.   On: 02/05/2015 06:14     Microbiology: Recent Results (from the past 240 hour(s))  Culture, blood (routine x 2)     Status: None (Preliminary result)   Collection Time: 02/28/15  6:34 AM  Result Value Ref Range Status   Specimen Description BLOOD LEFT ARM  Final   Special Requests BOTTLES DRAWN AEROBIC AND ANAEROBIC 10CC  Final   Culture NO GROWTH 4 DAYS  Final   Report Status PENDING  Incomplete  Culture, blood (routine x 2)     Status: None (Preliminary result)   Collection Time: 02/28/15  6:35 AM  Result Value Ref Range Status   Specimen Description BLOOD LEFT HAND  Final  Special Requests BOTTLES DRAWN AEROBIC AND ANAEROBIC 10CC  Final   Culture NO GROWTH 4 DAYS  Final   Report Status PENDING  Incomplete     Labs: Basic Metabolic Panel:  Recent Labs Lab 02/28/15 0418 03/01/15 0635 03/02/15 0233 03/03/15 0520 03/04/15 0415  NA 133* 134* 135 136 137  K 3.6 3.9 4.4 4.2 4.3  CL 96* 102 101 102 103  CO2 27 24 24 24 26   GLUCOSE 116* 107* 105* 101* 106*  BUN 6 9 9 10 8   CREATININE 0.93 0.93 0.99 0.88 0.97  CALCIUM 8.9 9.0 8.9 8.9 9.1   Liver Function Tests:  Recent Labs Lab 02/27/15 1505 03/01/15 0635  AST 16 12*  ALT 16* 12*  ALKPHOS 57 46  BILITOT 0.6 0.8  PROT 7.6 7.2  ALBUMIN 3.6 2.9*    Recent Labs Lab 02/27/15 1505   LIPASE 25   No results for input(s): AMMONIA in the last 168 hours. CBC:  Recent Labs Lab 03/01/15 0635 03/02/15 0233 03/03/15 0520 03/04/15 0415 03/05/15 0755  WBC 12.1* 11.9* 9.3 9.1 7.7  HGB 12.5* 11.9* 10.9* 11.9* 11.5*  HCT 37.8* 36.4* 33.8* 36.4* 35.7*  MCV 88.1 88.1 87.8 87.7 87.9  PLT 244 247 222 246 296   Cardiac Enzymes: No results for input(s): CKTOTAL, CKMB, CKMBINDEX, TROPONINI in the last 168 hours. BNP: Invalid input(s): POCBNP CBG: No results for input(s): GLUCAP in the last 168 hours.  Time coordinating discharge:  Greater than 30 minutes  Signed:  Mivaan Corbitt, DO [facility] Pager: [phone] 03/05/2015, 11:46 AM

## 2015-03-04 NOTE — Progress Notes (Signed)
Subjective: Less abdominal pain, now hungry  Objective: Vital signs in last 24 hours: Temp:  [98.2 F (36.8 C)-98.7 F (37.1 C)] 98.7 F (37.1 C) (12/10 2203) Pulse Rate:  [80-82] 82 (12/10 2203) Resp:  [17-18] 17 (12/10 2203) BP: (110-123)/(68-80) 123/80 mmHg (12/10 2203) SpO2:  [98 %-99 %] 99 % (12/10 2203) Last BM Date: 03/02/15  Intake/Output from previous day: 12/10 0701 - 12/11 0700 In: 240 [P.O.:240] Out: -  Intake/Output this shift:    General appearance: alert and cooperative Resp: clear to auscultation bilaterally Cardio: regular rate and rhythm GI: soft, minimal tenderness LLQ  Lab Results:   Recent Labs  03/03/15 0520 03/04/15 0415  WBC 9.3 9.1  HGB 10.9* 11.9*  HCT 33.8* 36.4*  PLT 222 246   BMET  Recent Labs  03/03/15 0520 03/04/15 0415  NA 136 137  K 4.2 4.3  CL 102 103  CO2 24 26  GLUCOSE 101* 106*  BUN 10 8  CREATININE 0.88 0.97  CALCIUM 8.9 9.1   PT/INR No results for input(s): LABPROT, INR in the last 72 hours. ABG No results for input(s): PHART, HCO3 in the last 72 hours.  Invalid input(s): PCO2, PO2  Studies/Results: No results found.  Anti-infectives: Anti-infectives    Start     Dose/Rate Route Frequency Ordered Stop   02/28/15 1400  piperacillin-tazobactam (ZOSYN) IVPB 3.375 g  Status:  Discontinued     3.375 g 100 mL/hr over 30 Minutes Intravenous 3 times per day 02/28/15 1055 02/28/15 1103   02/28/15 1400  piperacillin-tazobactam (ZOSYN) IVPB 3.375 g     3.375 g 12.5 mL/hr over 240 Minutes Intravenous 3 times per day 02/28/15 1104     02/28/15 1200  vancomycin (VANCOCIN) IVPB 1000 mg/200 mL premix  Status:  Discontinued     1,000 mg 200 mL/hr over 60 Minutes Intravenous Every 8 hours 02/28/15 0544 02/28/15 0848   02/28/15 1000  ciprofloxacin (CIPRO) IVPB 400 mg  Status:  Discontinued     400 mg 200 mL/hr over 60 Minutes Intravenous Every 12 hours 02/27/15 2210 02/28/15 0533   02/28/15 0600  cefTAZidime  (FORTAZ) 2 g in dextrose 5 % 50 mL IVPB  Status:  Discontinued     2 g 100 mL/hr over 30 Minutes Intravenous 3 times per day 02/28/15 0533 02/28/15 1055   02/28/15 0545  vancomycin (VANCOCIN) IVPB 1000 mg/200 mL premix  Status:  Discontinued     1,000 mg 200 mL/hr over 60 Minutes Intravenous  Once 02/28/15 0533 02/28/15 0542   02/28/15 0545  vancomycin (VANCOCIN) IVPB 1000 mg/200 mL premix     1,000 mg 200 mL/hr over 60 Minutes Intravenous  Once 02/28/15 0544 02/28/15 0714   02/28/15 0430  metroNIDAZOLE (FLAGYL) IVPB 500 mg  Status:  Discontinued     500 mg 100 mL/hr over 60 Minutes Intravenous Every 8 hours 02/27/15 2213 02/28/15 1055   02/27/15 2230  metroNIDAZOLE (FLAGYL) IVPB 500 mg  Status:  Discontinued     500 mg 100 mL/hr over 60 Minutes Intravenous Every 8 hours 02/27/15 2210 02/27/15 2213   02/27/15 2100  metroNIDAZOLE (FLAGYL) IVPB 1 g     1 g 200 mL/hr over 60 Minutes Intravenous  Once 02/27/15 2006 02/27/15 2128   02/27/15 2015  ciprofloxacin (CIPRO) IVPB 400 mg     400 mg 200 mL/hr over 60 Minutes Intravenous  Once 02/27/15 2006 02/27/15 2128      Assessment/Plan: Diverticulitis with microperforation - WBC WNL, pain a lot  less, start full liquids, continue Zosyn. Hope to advance diet and change to PO ABX tomorrow.  LOS: 5 days    Arissa Fagin E 03/04/2015

## 2015-03-05 LAB — CBC
HEMATOCRIT: 35.7 % — AB (ref 39.0–52.0)
HEMOGLOBIN: 11.5 g/dL — AB (ref 13.0–17.0)
MCH: 28.3 pg (ref 26.0–34.0)
MCHC: 32.2 g/dL (ref 30.0–36.0)
MCV: 87.9 fL (ref 78.0–100.0)
Platelets: 296 10*3/uL (ref 150–400)
RBC: 4.06 MIL/uL — AB (ref 4.22–5.81)
RDW: 13.3 % (ref 11.5–15.5)
WBC: 7.7 10*3/uL (ref 4.0–10.5)

## 2015-03-05 LAB — CULTURE, BLOOD (ROUTINE X 2)
CULTURE: NO GROWTH
Culture: NO GROWTH

## 2015-03-05 MED ORDER — LISINOPRIL 10 MG PO TABS: 10.0000 mg | ORAL_TABLET | Freq: Every day | ORAL | Status: DC

## 2015-03-05 MED ORDER — OXYCODONE HCL 5 MG PO TABS: 5.0000 mg | ORAL_TABLET | ORAL | Status: DC | PRN

## 2015-03-05 MED ORDER — CIPROFLOXACIN HCL 500 MG PO TABS: 500.0000 mg | ORAL_TABLET | Freq: Two times a day (BID) | ORAL | Status: DC

## 2015-03-05 MED ORDER — METRONIDAZOLE 500 MG PO TABS: 500.0000 mg | ORAL_TABLET | Freq: Three times a day (TID) | ORAL | Status: DC

## 2015-03-05 NOTE — Progress Notes (Signed)
Discharged home with mother via personal vehicle. Pt was ambulatory at time of discharge with no complaints of pain or distress noted. Pt was alert and oriented x4 at time of discharge. Able to follow all commands.

## 2015-03-05 NOTE — Progress Notes (Signed)
Subjective: Denies pain Wants to eat  Objective: Vital signs in last 24 hours: Temp:  [97.4 F (36.3 C)-98.7 F (37.1 C)] 98 F (36.7 C) (12/12 0530) Pulse Rate:  [70-89] 70 (12/12 0533) Resp:  [18] 18 (12/12 0530) BP: (96-128)/(57-81) 104/70 mmHg (12/12 0533) SpO2:  [97 %-99 %] 99 % (12/12 0530) Last BM Date: 03/02/15  Intake/Output from previous day: 12/11 0701 - 12/12 0700 In: 600 [P.O.:600] Out: 4 [Urine:4] Intake/Output this shift:    Abdomen soft, non tender  Lab Results:   Recent Labs  03/03/15 0520 03/04/15 0415  WBC 9.3 9.1  HGB 10.9* 11.9*  HCT 33.8* 36.4*  PLT 222 246   BMET  Recent Labs  03/03/15 0520 03/04/15 0415  NA 136 137  K 4.2 4.3  CL 102 103  CO2 24 26  GLUCOSE 101* 106*  BUN 10 8  CREATININE 0.88 0.97  CALCIUM 8.9 9.1   PT/INR No results for input(s): LABPROT, INR in the last 72 hours. ABG No results for input(s): PHART, HCO3 in the last 72 hours.  Invalid input(s): PCO2, PO2  Studies/Results: No results found.  Anti-infectives: Anti-infectives    Start     Dose/Rate Route Frequency Ordered Stop   02/28/15 1400  piperacillin-tazobactam (ZOSYN) IVPB 3.375 g  Status:  Discontinued     3.375 g 100 mL/hr over 30 Minutes Intravenous 3 times per day 02/28/15 1055 02/28/15 1103   02/28/15 1400  piperacillin-tazobactam (ZOSYN) IVPB 3.375 g     3.375 g 12.5 mL/hr over 240 Minutes Intravenous 3 times per day 02/28/15 1104     02/28/15 1200  vancomycin (VANCOCIN) IVPB 1000 mg/200 mL premix  Status:  Discontinued     1,000 mg 200 mL/hr over 60 Minutes Intravenous Every 8 hours 02/28/15 0544 02/28/15 0848   02/28/15 1000  ciprofloxacin (CIPRO) IVPB 400 mg  Status:  Discontinued     400 mg 200 mL/hr over 60 Minutes Intravenous Every 12 hours 02/27/15 2210 02/28/15 0533   02/28/15 0600  cefTAZidime (FORTAZ) 2 g in dextrose 5 % 50 mL IVPB  Status:  Discontinued     2 g 100 mL/hr over 30 Minutes Intravenous 3 times per day 02/28/15  0533 02/28/15 1055   02/28/15 0545  vancomycin (VANCOCIN) IVPB 1000 mg/200 mL premix  Status:  Discontinued     1,000 mg 200 mL/hr over 60 Minutes Intravenous  Once 02/28/15 0533 02/28/15 0542   02/28/15 0545  vancomycin (VANCOCIN) IVPB 1000 mg/200 mL premix     1,000 mg 200 mL/hr over 60 Minutes Intravenous  Once 02/28/15 0544 02/28/15 0714   02/28/15 0430  metroNIDAZOLE (FLAGYL) IVPB 500 mg  Status:  Discontinued     500 mg 100 mL/hr over 60 Minutes Intravenous Every 8 hours 02/27/15 2213 02/28/15 1055   02/27/15 2230  metroNIDAZOLE (FLAGYL) IVPB 500 mg  Status:  Discontinued     500 mg 100 mL/hr over 60 Minutes Intravenous Every 8 hours 02/27/15 2210 02/27/15 2213   02/27/15 2100  metroNIDAZOLE (FLAGYL) IVPB 1 g     1 g 200 mL/hr over 60 Minutes Intravenous  Once 02/27/15 2006 02/27/15 2128   02/27/15 2015  ciprofloxacin (CIPRO) IVPB 400 mg     400 mg 200 mL/hr over 60 Minutes Intravenous  Once 02/27/15 2006 02/27/15 2128      Assessment/Plan:  Diverticulitis  Improved.  Russell Adams for discharge from gen surg standpoint. Needs to also f/u with [facility] for colonoscopy. Can see Dr. Hulen Skains  at [facility] for follow up in 3 to 4 weeks Will sign off  LOS: 6 days    Russell Adams A 03/05/2015

## 2015-03-20 ENCOUNTER — Telehealth: Payer: Self-pay | Admitting: Gastroenterology

## 2015-03-20 NOTE — Telephone Encounter (Signed)
Left message for patient to call back  

## 2015-03-22 NOTE — Telephone Encounter (Signed)
Left message for patient to call back  

## 2015-03-27 ENCOUNTER — Encounter: Payer: Self-pay | Admitting: Gastroenterology

## 2015-03-27 NOTE — Telephone Encounter (Signed)
Patient has been scheduled for 05/21/15 with Dr. Fuller Plan

## 2015-05-21 ENCOUNTER — Ambulatory Visit: Payer: Medicaid Other | Admitting: Gastroenterology

## 2015-08-05 ENCOUNTER — Ambulatory Visit (HOSPITAL_BASED_OUTPATIENT_CLINIC_OR_DEPARTMENT_OTHER): Payer: Medicaid Other | Attending: Anesthesiology | Admitting: Internal Medicine

## 2015-08-05 VITALS — Ht 72.0 in | Wt 209.0 lb

## 2015-08-05 DIAGNOSIS — R0683 Snoring: Secondary | ICD-10-CM | POA: Insufficient documentation

## 2015-08-05 DIAGNOSIS — R5383 Other fatigue: Secondary | ICD-10-CM | POA: Diagnosis not present

## 2015-08-05 DIAGNOSIS — Z79899 Other long term (current) drug therapy: Secondary | ICD-10-CM | POA: Diagnosis not present

## 2015-08-05 DIAGNOSIS — G4733 Obstructive sleep apnea (adult) (pediatric): Secondary | ICD-10-CM | POA: Insufficient documentation

## 2015-08-09 ENCOUNTER — Ambulatory Visit: Payer: Medicaid Other | Attending: Anesthesiology | Admitting: Physical Therapy

## 2015-08-09 DIAGNOSIS — M5441 Lumbago with sciatica, right side: Secondary | ICD-10-CM

## 2015-08-09 NOTE — Therapy (Signed)
[facility] [address] [place], [place], [address] Phone: [phone]   Fax:  [phone]  Physical Therapy Evaluation/One time visit  Patient Details  Name: Russell Adams MRN: [id number] Date of Birth: 05/25/1970 Referring Provider: Gwenyth Bouillon   Encounter Date: 08/09/2015      PT End of Session - 08/09/15 1104    Visit Number 1   Number of Visits 1   Authorization Type Medicaid 1x visit   PT Start Time [id number]   PT Stop Time 1055   PT Time Calculation (min) 40 min   Activity Tolerance Patient tolerated treatment well      Past Medical History  Diagnosis Date  . Hypertension   . Hypercholesterolemia   . GERD (gastroesophageal reflux disease)   . Diverticulitis   . Depression   . Chronic back pain   . Barrett esophagus   . BPH (benign prostatic hyperplasia)   . IBS (irritable bowel syndrome)   . Hyperglyceridemia   . Fibromyalgia   . PTSD (post-traumatic stress disorder)   . Insomnia   . Allergic rhinitis   . Tobacco use   . Alcohol dependence ([place])   . Arthritis   . Neuropathy ([place])   . Diverticulosis   . Hiatal hernia     Past Surgical History  Procedure Laterality Date  . Hernia repair Right     inguinal    There were no vitals filed for this visit.       Subjective Assessment - 08/09/15 1021    Subjective Patient reports degenerative disc disease of L4-5, L5-S1 with pinched nerves right thigh pain to knee since 45 years old.  Left groin pain.   Also with fibromyalgia.  AVN left femur/hip   Pertinent History hi BP; PTSD;  3 weeks ago hernia surgery still really   Limitations Walking;Standing;House hold activities   Diagnostic tests MRI  DDD; U/S for hernia   Patient Stated Goals Learn how to manage   Currently in Pain? Yes   Pain Score 6    Pain Location Back   Pain Orientation Right   Pain Type Chronic pain   Pain Onset More than a month ago   Pain Frequency Constant   Aggravating  Factors  bending, standing, sitting,  AM            [facility] PT Assessment - 08/09/15 0001    Assessment   Medical Diagnosis Lumbar DDD   Referring Provider Dwakwa    Onset Date/Surgical Date --  since 45 years old   Hand Dominance Right   Next MD Visit tomorrow   Prior Therapy no   Precautions   Precautions --  no lift > 25#   Restrictions   Weight Bearing Restrictions No   Balance Screen   Has the patient fallen in the past 6 months No   Has the patient had a decrease in activity level because of a fear of falling?  No   Is the patient reluctant to leave their home because of a fear of falling?  No   Home Environment   Living Environment Private residence   Home Access Stairs to enter   [place] Two level   Prior Function   Level of Independence Independent   Vocation Unemployed  fill in as Astronomer   Leisure take care of dog; music    Observation/Other Assessments   Focus on Therapeutic Outcomes (FOTO)  not performed secondary to 1x visit   Posture/Postural Control  Posture/Postural Control Postural limitations   Postural Limitations Decreased lumbar lordosis   ROM / Strength   AROM / PROM / Strength AROM;Strength   AROM   AROM Assessment Site Lumbar   Lumbar Flexion 50   Lumbar Extension 12   Lumbar - Right Side Bend 20   Lumbar - Left Side Bend 25   Strength   Strength Assessment Site Lumbar   Lumbar Flexion 3+/5   Lumbar Extension 3+/5   Flexibility   Soft Tissue Assessment /Muscle Length yes   Hamstrings bilaterally   Quadriceps Bilaterally   Palpation   Palpation comment right lumbar tenderness                            PT Education - 08/09/15 1053    Education provided Yes   Education Details basic back self care; ab brace;  basic lifting; use of lumbar roll   Person(s) Educated Patient   Methods Explanation;Demonstration;Handout             PT Long Term Goals - 08/09/15 2159    PT LONG TERM GOAL #1   Title The  patient will be instructed in basic self care strategies for LBP management    Time 1   Period Days   Status Achieved               Plan - 08/09/15 2152    Clinical Impression Statement The patient is of moderate complexity.  He has a multi decade history of LBP including right anterior thigh pain to the knee with worsening of symptoms over the last year.  He also has left hip avascular necrosis and fibromyalgia.  MRI shows DDD of 2 lumbar discs.  Limited and painful lumbar  AROM with no directional movement preference.  Decreased HS muscle lengths bilaterally.  Decreased core muscle strength 3/5.   Decreased lumbar lordosis.  The patient lacks knowledge of sitting postural correction, basic body mechanics with picking something up off the floor and an appropriate HEP.  The patient does not have a qualifying diagnosis covered by Medicaid and the patient is unable to continue treatment secondary to financial reasons.  He was instructed in use of lumbar roll, basic lifting principles, use of TENS for pain control.  walking program and abdominal bracing in supine.     Rehab Potential Good   PT Frequency One time visit      Patient will benefit from skilled therapeutic intervention in order to improve the following deficits and impairments:  Improper body mechanics, Pain, Decreased strength, Decreased range of motion  Visit Diagnosis: Bilateral low back pain with right-sided sciatica - Plan: PT plan of care cert/re-cert     Problem List Patient Active Problem List   Diagnosis Date Noted  . Alcohol abuse 02/28/2015  . Hypokalemia 02/28/2015  . Hyponatremia 02/28/2015  . Avascular necrosis of left femoral head ([place]) 02/28/2015  . Diverticulitis of large intestine with perforation without bleeding   . Diverticulitis 02/27/2015  . Paresthesia 05/26/2014  . Midline low back pain without sciatica 05/26/2014  . SHOULDER STRAIN, RIGHT 11/16/2009  . GLUCOSE INTOLERANCE 05/17/2009  .  FATTY LIVER DISEASE 05/09/2009  . HYPERTRIGLYCERIDEMIA 05/02/2009  . TRANSAMINASES, SERUM, ELEVATED 05/01/2009  . Essential hypertension 04/18/2009  . CARPAL TUNNEL SYNDROME 03/06/2009  . ELEVATED BLOOD PRESSURE 03/06/2009  . DIARRHEA 04/07/2007  . HYPERCHOLESTEROLEMIA 12/24/2006  . ANXIETY 12/24/2006  . DISORDER, TOBACCO USE 12/24/2006  . POST TRAUMATIC STRESS  SYNDROME 12/24/2006  . GERD 12/24/2006  . FIBROMYALGIA 12/24/2006   Ruben Im, PT 08/09/2015 10:03 PM Phone: [phone] Fax: [phone]   Alvera Singh 08/09/2015, 10:03 PM  [facility] [address] [place], [place], [address] Phone: [phone]   Fax:  [phone]  Name: Russell Adams MRN: [id number] Date of Birth: Jun 23, 1970

## 2015-08-09 NOTE — Patient Instructions (Signed)
  No sit ups.  Keep reps minimal 5 reps.   Limit sitting, use lumbar roll.    Keep walking.  For lifting, golfers lift or squat method.      Ruben Im PT [facility] [address], [place], [place] [address] Phone # [phone] Fax [phone]

## 2015-08-12 DIAGNOSIS — R5383 Other fatigue: Secondary | ICD-10-CM | POA: Diagnosis not present

## 2015-08-12 DIAGNOSIS — R0683 Snoring: Secondary | ICD-10-CM | POA: Diagnosis not present

## 2015-08-12 NOTE — Procedures (Signed)
  Patient Name: Russell Adams, Russell Adams Date: 08/05/2015 Gender: Male D.O.B: May 20, 1970 Age (years): 45 Referring Provider: Ivy Lynn Dakwa Height (inches): 72 Interpreting Physician: Baird Lyons MD, ABSM Weight (lbs): 209 RPSGT: Zadie Rhine BMI: 28 MRN: [id number] Neck Size: 16.50 CLINICAL INFORMATION Sleep Study Type: NPSG Indication for sleep study: Fatigue, Snoring Epworth Sleepiness Score: 4  SLEEP STUDY TECHNIQUE As per the AASM Manual for the Scoring of Sleep and Associated Events v2.3 (April 2016) with a hypopnea requiring 4% desaturations. The channels recorded and monitored were frontal, central and occipital EEG, electrooculogram (EOG), submentalis EMG (chin), nasal and oral airflow, thoracic and abdominal wall motion, anterior tibialis EMG, snore microphone, electrocardiogram, and pulse oximetry.  MEDICATIONS Patient's medications include: charted for review Medications self-administered by patient during sleep study : ZYRTEC, KLONOPIN, [place], [place], [place].  SLEEP ARCHITECTURE The study was initiated at 9:30:06 PM and ended at 4:51:28 AM. Sleep onset time was 30.3 minutes and the sleep efficiency was 91.1%. The total sleep time was 402.0 minutes. Stage REM latency was 137.5 minutes. The patient spent 2.61% of the night in stage N1 sleep, 63.06% in stage N2 sleep, 0.00% in stage N3 and 34.33% in REM. Alpha intrusion was absent. Supine sleep was 50.12%.  RESPIRATORY PARAMETERS The overall apnea/hypopnea index (AHI) was 6.9 per hour. There were 2 total apneas, including 2 obstructive, 0 central and 0 mixed apneas. There were 44 hypopneas and 35 RERAs. The AHI during Stage REM sleep was 9.1 per hour. AHI while supine was 13.4 per hour. The mean oxygen saturation was 91.41%. The minimum SpO2 during sleep was 85.00%. Soft snoring was noted during this study.  CARDIAC DATA The 2 lead EKG demonstrated sinus rhythm. The mean heart rate was 86.37 beats per minute.  Other EKG findings include: None.  LEG MOVEMENT DATA The total PLMS were 485 with a resulting PLMS index of 72.39. Associated arousal with leg movement index was 7.0 .  IMPRESSIONS - Mild obstructive sleep apnea occurred during this study (AHI = 6.9/h). - No significant central sleep apnea occurred during this study (CAI = 0.0/h). - Mild oxygen desaturation was noted during this study (Min O2 = 85.00%). - The patient snored with Soft snoring volume. - No cardiac abnormalities were noted during this study. - Severe periodic limb movements of sleep occurred during the study. Associated arousals were significant.  DIAGNOSIS - Obstructive Sleep Apnea (327.23 [G47.33 ICD-10])  RECOMMENDATIONS - Very mild obstructive sleep apnea. Return to discuss treatment options. - Positional therapy avoiding supine position during sleep. - Avoid alcohol, sedatives and other CNS depressants that may worsen sleep apnea and disrupt normal sleep architecture. - Sleep hygiene should be reviewed to assess factors that may improve sleep quality. - Weight management and regular exercise should be initiated or continued if appropriate.  Deneise Lever Diplomate, American Board of Sleep Medicine  ELECTRONICALLY SIGNED ON:  08/12/2015, 12:49 PM [place] PH: [phone]   FX: [phone] [place]

## 2017-02-20 ENCOUNTER — Encounter: Payer: Self-pay | Admitting: Family Medicine

## 2017-02-20 ENCOUNTER — Ambulatory Visit (INDEPENDENT_AMBULATORY_CARE_PROVIDER_SITE_OTHER): Payer: Medicaid Other | Admitting: Family Medicine

## 2017-02-20 VITALS — BP 136/88 | HR 75 | Temp 98.8°F | Ht 72.0 in | Wt 229.0 lb

## 2017-02-20 DIAGNOSIS — F17219 Nicotine dependence, cigarettes, with unspecified nicotine-induced disorders: Secondary | ICD-10-CM

## 2017-02-20 DIAGNOSIS — M797 Fibromyalgia: Secondary | ICD-10-CM

## 2017-02-20 DIAGNOSIS — I10 Essential (primary) hypertension: Secondary | ICD-10-CM | POA: Diagnosis not present

## 2017-02-20 DIAGNOSIS — G894 Chronic pain syndrome: Secondary | ICD-10-CM

## 2017-02-20 DIAGNOSIS — Z23 Encounter for immunization: Secondary | ICD-10-CM

## 2017-02-20 DIAGNOSIS — M79673 Pain in unspecified foot: Secondary | ICD-10-CM | POA: Insufficient documentation

## 2017-02-20 DIAGNOSIS — F339 Major depressive disorder, recurrent, unspecified: Secondary | ICD-10-CM | POA: Diagnosis not present

## 2017-02-20 DIAGNOSIS — J309 Allergic rhinitis, unspecified: Secondary | ICD-10-CM | POA: Insufficient documentation

## 2017-02-20 DIAGNOSIS — M79672 Pain in left foot: Secondary | ICD-10-CM

## 2017-02-20 DIAGNOSIS — M79671 Pain in right foot: Secondary | ICD-10-CM | POA: Diagnosis not present

## 2017-02-20 NOTE — Patient Instructions (Signed)
It was so good seeing you again! Thank you for establishing with my new practice and allowing me to continue caring for you. It means a lot to me.   Please return in February for a complete physical, come fasting.   We will call you with information regarding your referral appointment. Podiatrist for foot pain
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
## 2017-05-21 ENCOUNTER — Encounter: Payer: Medicaid Other | Admitting: Family Medicine

## 2017-07-02 ENCOUNTER — Ambulatory Visit (INDEPENDENT_AMBULATORY_CARE_PROVIDER_SITE_OTHER): Payer: Medicaid Other

## 2017-07-02 ENCOUNTER — Encounter: Payer: Self-pay | Admitting: Family Medicine

## 2017-07-02 ENCOUNTER — Ambulatory Visit: Payer: Medicaid Other | Admitting: Podiatry

## 2017-07-02 DIAGNOSIS — G5761 Lesion of plantar nerve, right lower limb: Secondary | ICD-10-CM

## 2017-07-02 DIAGNOSIS — M79671 Pain in right foot: Secondary | ICD-10-CM

## 2017-07-02 DIAGNOSIS — M79672 Pain in left foot: Principal | ICD-10-CM

## 2017-07-02 DIAGNOSIS — G5762 Lesion of plantar nerve, left lower limb: Secondary | ICD-10-CM

## 2017-07-02 DIAGNOSIS — G5763 Lesion of plantar nerve, bilateral lower limbs: Secondary | ICD-10-CM

## 2017-07-02 NOTE — Patient Instructions (Signed)
Morton Neuralgia Morton neuralgia is a type of foot pain in the area closest to your toes. This area is sometimes called the ball of your foot. Morton neuralgia occurs when a branch of a nerve in your foot (digital nerve) becomes compressed. When this happens over a long period of time, the nerve can thicken (neuroma) and cause pain. This usually occurs between the third and fourth toe. Morton neuralgia can come and go but may get worse over time. What are the causes? Your digital nerve can become compressed and stretched at a point where it passes under a thick band of tissue that connects your toes (intermetatarsal ligament). Morton neuralgia can be caused by mild repetitive damage in this area. This type of damage can result from:  Activities such as running or jumping.  Wearing shoes that are too tight.  What increases the risk? You may be at risk for Morton neuralgia if you:  Are male.  Wear high heels.  Wear shoes that are narrow or tight.  Participate in activities that stretch your toes. These include: ? Running. ? Ballet. ? Long-distance walking.  What are the signs or symptoms? The first symptom of Morton neuralgia is pain that spreads from the ball of your foot to your toes. It may feel like you are walking on a marble. Pain usually gets worse with walking and goes away at night. Other symptoms may include numbness and cramping of your toes. How is this diagnosed? Your health care provider will do a physical exam. When doing the exam, your health care provider may:  Squeeze your foot just behind your toe.  Ask you to move your toes to check for pain.  You may also have tests on your foot to confirm the diagnosis. These may include:  An X-ray.  An MRI.  How is this treated? Treatment for Morton neuralgia may be as simple as changing the kind of shoes you wear. Other treatments may include:  Wearing a supportive pad (orthosis) under the front of your foot. This  lifts your toe bones and takes pressure off the nerve.  Getting injections of numbing medicine and anti-inflammatory medicine (steroid) in the nerve.  Having surgery to remove part of the thickened nerve.  Follow these instructions at home:  Take medicine only as directed by your health care provider.  Wear soft-soled shoes with a wide toe area.  Stop activities that may be causing pain.  Elevate your foot when resting.  Massage your foot.  Apply ice to the injured area: ? Put ice in a plastic bag. ? Place a towel between your skin and the bag. ? Leave the ice on for 20 minutes, 2-3 times a day.  Keep all follow-up visits as directed by your health care provider. This is important. Contact a health care provider if:  Home care instructions are not helping you get better.  Your symptoms change or get worse. This information is not intended to replace advice given to you by your health care provider. Make sure you discuss any questions you have with your health care provider. Document Released: 06/16/2000 Document Revised: 08/16/2015 Document Reviewed: 05/11/2013 Elsevier Interactive Patient Education  2018 Elsevier Inc.  

## 2017-07-06 ENCOUNTER — Telehealth: Payer: Self-pay | Admitting: *Deleted

## 2017-07-06 NOTE — Telephone Encounter (Signed)
I spoke with pt and informed that it sounded like a steroid flare, to treat with ice 3-4 times a day for 15 minutes each session and I  Would inform Dr. March Rummage of the symptoms and call again if instructions changed.

## 2017-07-06 NOTE — Telephone Encounter (Signed)
Pt states the left foot is now as bad as the right, does he need to come in sooner than 6 week appt.

## 2017-07-21 NOTE — Progress Notes (Signed)
Subjective:  Patient ID: Russell Adams, male    DOB: 1970-10-15,  MRN: [id number]  Chief Complaint  Patient presents with  . Foot Pain    bilateral chronic   47 y.o. male presents with the above complaint.  Reports chronic bilateral foot pain in the balls of both feet.  Endorses burning and occasional tingle into the feet.  History of fibromyalgia.  Also has lumbar spondylosis without evidence of radiculopathy.  Past Medical History:  Diagnosis Date  . Alcohol dependence ([place])   . Allergic rhinitis   . Arthritis   . Barrett esophagus   . BPH (benign prostatic hyperplasia)   . Chronic back pain   . Colon polyps   . Depression   . Diverticulitis   . Diverticulosis   . Fibromyalgia   . GERD (gastroesophageal reflux disease)   . Hiatal hernia   . History of alcohol abuse   . History of chicken pox   . Hypercholesterolemia   . Hyperglyceridemia   . Hypertension   . IBS (irritable bowel syndrome)   . Insomnia   . Migraines   . Neuropathy   . PTSD (post-traumatic stress disorder)   . Seizures ([place])   . Tobacco use    Past Surgical History:  Procedure Laterality Date  . HERNIA REPAIR Right 20115/2017   inguinal x2    Current Outpatient Medications:  .  acetaminophen (TYLENOL) 500 MG tablet, Take 1,000 mg by mouth every 6 (six) hours as needed for mild pain., Disp: , Rfl:  .  Buprenorphine HCl-Naloxone HCl 4-1 MG FILM, Take 1 Film by mouth 3 (three) times daily., Disp: , Rfl:  .  cetirizine (ZYRTEC) 10 MG tablet, Take 10 mg by mouth daily. , Disp: , Rfl:  .  clonazePAM (KLONOPIN) 1 MG tablet, Take 1 mg by mouth 2 (two) times daily as needed for anxiety. , Disp: , Rfl:  .  fenofibrate (TRICOR) 145 MG tablet, Take 145 mg by mouth daily. , Disp: , Rfl:  .  gabapentin (NEURONTIN) 300 MG capsule, Take 300 mg by mouth 4 (four) times daily., Disp: , Rfl:  .  hydrochlorothiazide (HYDRODIURIL) 25 MG tablet, Take 1 tablet by mouth daily., Disp: , Rfl:  .  omeprazole (PRILOSEC) 20  MG capsule, Take 20 mg by mouth 2 (two) times daily before a meal. , Disp: , Rfl:  .  sertraline (ZOLOFT) 100 MG tablet, Take 100 mg by mouth daily., Disp: , Rfl:   Allergies  Allergen Reactions  . Vitamin B12 Anxiety and Shortness Of Breath    Pills  . Metaxalone Anxiety   Review of Systems: Negative except as noted in the HPI. Denies N/V/F/Ch. Objective:  There were no vitals filed for this visit. General AA&O x3. Normal mood and affect.  Vascular Dorsalis pedis and posterior tibial pulses  present 2+ bilaterally  Capillary refill normal to all digits. Pedal hair growth normal.  Neurologic Epicritic sensation grossly present.  Dermatologic No open lesions. Interspaces clear of maceration. Nails well groomed and normal in appearance.  Orthopedic: MMT 5/5 in dorsiflexion, plantarflexion, inversion, and eversion. Normal joint ROM without pain or crepitus. Pain palpation of the third interspace bilateral with positive Mulder's click   Assessment & Plan:  Patient was evaluated and treated and all questions answered.  Morton's Neuroma Bilat -X-rays taken and reviewed no acute fractures or dislocations- -Educated on etiology. -Pads dispensed. -Bilateral injections as below.  Procedure: Neuroma Injection Location: Bilateral 3rd interspace Skin Prep: Alcohol. Injectate: 0.5 cc  0.5% marcaine plain, 0.5 cc dexamethasone phosphate. Disposition: Patient tolerated procedure well. Injection site dressed with a band-aid.  Return in about 6 weeks (around 08/13/2017).

## 2017-07-27 MED FILL — SERTRALINE HCL 100 MG TAB: 100 | 30 days supply | Qty: 45 | Fill #0 | Status: TO

## 2017-07-27 MED FILL — OMEGA-3 ETHYL ESTERS 1 GM C: 1 | 30 days supply | Qty: 30 | Fill #0 | Status: TO

## 2017-07-27 MED FILL — FENOFIBRATE 145 MG TAB: 145 | 34 days supply | Qty: 34 | Fill #0 | Status: TO

## 2017-07-29 MED FILL — CETIRIZINE HCL 10 MG TABS: 10 | 30 days supply | Qty: 30 | Fill #0 | Status: TO

## 2017-07-30 MED FILL — SUBOXONE 8 MG-2 MG SL FILM: 8-2 | 30 days supply | Qty: 60 | Fill #0 | Status: TO

## 2017-08-03 MED FILL — OMEPRAZOLE 20 MG CAP: 20 | 30 days supply | Qty: 60 | Fill #0 | Status: TO

## 2017-08-03 MED FILL — TAMSULOSIN HCL 0.4 MG CAP: 0.4 | 30 days supply | Qty: 30 | Fill #0 | Status: TO

## 2017-08-03 MED FILL — GABAPENTIN 300 MG CAPSULE: 300 | 30 days supply | Qty: 180 | Fill #0 | Status: TO

## 2017-08-05 MED FILL — HYDROCHLOROTHIAZIDE 25 MG T: 25 | 30 days supply | Qty: 30 | Fill #0 | Status: TO

## 2017-08-13 ENCOUNTER — Ambulatory Visit: Payer: Medicaid Other | Admitting: Podiatry

## 2017-08-13 DIAGNOSIS — G5762 Lesion of plantar nerve, left lower limb: Secondary | ICD-10-CM | POA: Diagnosis not present

## 2017-08-13 DIAGNOSIS — G5763 Lesion of plantar nerve, bilateral lower limbs: Secondary | ICD-10-CM

## 2017-08-13 DIAGNOSIS — G5761 Lesion of plantar nerve, right lower limb: Secondary | ICD-10-CM

## 2017-08-13 NOTE — Progress Notes (Signed)
  Subjective:  Patient ID: Russell Adams, male    DOB: October 29, 1970,  MRN: [id number]  Chief Complaint  Patient presents with  . Foot Pain    bilateral - got better for a few days after last visit but is now back to original pain   47 y.o. male returns for the above complaint. States the injections helped for a few days but the pain is now back to where it was originally.  Objective:  There were no vitals filed for this visit. General AA&O x3. Normal mood and affect.  Vascular Pedal pulses palpable.  Neurologic Epicritic sensation grossly intact.  Dermatologic No open lesions. Skin normal texture and turgor.  Orthopedic: Pain on palpation 3rd IS bilat with mulder's click.   Assessment & Plan:  Patient was evaluated and treated and all questions answered.  Morton Neuroma Bilat -Repeat injection as below.  Procedure: Neuroma Injection Location: Bilateral 3rd interspace Skin Prep: Alcohol. Injectate: 0.5 cc 0.5% marcaine plain, 0.5 cc dexamethasone phosphate. Disposition: Patient tolerated procedure well. Injection site dressed with a band-aid.   Return in about 6 weeks (around 09/24/2017) for Neuroma.

## 2017-10-01 ENCOUNTER — Ambulatory Visit: Payer: Medicaid Other | Admitting: Podiatry

## 2017-10-01 DIAGNOSIS — G5763 Lesion of plantar nerve, bilateral lower limbs: Secondary | ICD-10-CM

## 2017-10-01 DIAGNOSIS — G5761 Lesion of plantar nerve, right lower limb: Secondary | ICD-10-CM

## 2017-10-01 DIAGNOSIS — G5762 Lesion of plantar nerve, left lower limb: Secondary | ICD-10-CM

## 2017-10-12 NOTE — Progress Notes (Signed)
  Subjective:  Patient ID: Russell Adams, male    DOB: 1970/06/04,  MRN: [id number]  Chief Complaint  Patient presents with  . Neuroma      6 wk neuroma f/u bilateral   47 y.o. male returns for the above complaint.  Doing much better.  Objective:  There were no vitals filed for this visit. General AA&O x3. Normal mood and affect.  Vascular Pedal pulses palpable.  Neurologic Epicritic sensation grossly intact.  Dermatologic No open lesions. Skin normal texture and turgor.  Orthopedic: Pain on palpation 3rd IS bilat with mulder's click.   Assessment & Plan:  Patient was evaluated and treated and all questions answered.  Morton Neuroma Bilat -Should pain persist would consider possible surgical excision  Return in about 1 month (around 10/29/2017) for Neuroma, Bilateral.

## 2017-10-19 ENCOUNTER — Telehealth: Payer: Self-pay | Admitting: Podiatry

## 2017-10-19 NOTE — Telephone Encounter (Signed)
I'm a pt of Dr. Eleanora Neighbor and I had two sets of steroid shots which were fine. Then the last one was a nerve ending shot for my morton's neuroma. It hurt really bad and I'm still walking funny. It's been almost a month now. I have a follow up appointment on 08 August but I don't know if I can take that pain again. I don't know, I know he probably can't numb my feet but the pain afterwards was really bad too for weeks. I just don't know if I'm ready to do that but I don't want to cancel my appointment until I talk to someone. The best number to reach me at is [phone]. Thank you.

## 2017-10-19 NOTE — Telephone Encounter (Addendum)
Pt states he has been in terrible pain since the last injections, the 1st week he had swelling and a red spot that ran a line up the outside of his foot, it has gotten better, and he can now go up the steps but he doesn't want the shots again. I told pt not to cancel his appt and to be consisting to with the ice therapy 3-4 times daily for 15-20 minutes/session, since he stated he was in pain management and couldn't take NSAIDS due to taking Suboxone. I told pt that he could discuss other options with Dr. Milinda Pointer at the next appt and transferred pt to scheduler to get earlier appt if available. Pt agreed.

## 2017-10-29 ENCOUNTER — Telehealth: Payer: Self-pay | Admitting: *Deleted

## 2017-10-29 ENCOUNTER — Encounter: Payer: Medicaid Other | Admitting: Podiatry

## 2017-10-29 DIAGNOSIS — D3610 Benign neoplasm of peripheral nerves and autonomic nervous system, unspecified: Secondary | ICD-10-CM

## 2017-10-29 NOTE — Telephone Encounter (Signed)
[employer]icaid requires clinicals for pre-cert evaluation.

## 2017-11-10 NOTE — Telephone Encounter (Signed)
Russell Adams - [employer] states time has expired for the PEER TO PEER for the denial of the MRI, must complete form for appeal.

## 2017-11-16 ENCOUNTER — Other Ambulatory Visit: Payer: Medicaid Other

## 2017-11-16 ENCOUNTER — Telehealth: Payer: Self-pay | Admitting: *Deleted

## 2017-11-16 NOTE — Telephone Encounter (Signed)
I told pt Dr. March Rummage had wanted to discuss the necessity of the MRI and offered pt an appt. Transferred pt to schedulers.

## 2017-11-16 NOTE — Telephone Encounter (Signed)
Pt states MRI can't be rescheduled without PA, does he need an appt with Dr. March Rummage.

## 2017-11-18 ENCOUNTER — Ambulatory Visit: Payer: Medicaid Other | Admitting: Podiatry

## 2017-11-18 ENCOUNTER — Encounter: Payer: Self-pay | Admitting: Podiatry

## 2017-11-18 DIAGNOSIS — G5761 Lesion of plantar nerve, right lower limb: Secondary | ICD-10-CM

## 2017-11-18 DIAGNOSIS — G5762 Lesion of plantar nerve, left lower limb: Secondary | ICD-10-CM | POA: Diagnosis not present

## 2017-11-18 DIAGNOSIS — R600 Localized edema: Secondary | ICD-10-CM

## 2017-11-18 DIAGNOSIS — G588 Other specified mononeuropathies: Secondary | ICD-10-CM

## 2017-11-18 DIAGNOSIS — M792 Neuralgia and neuritis, unspecified: Secondary | ICD-10-CM

## 2017-11-18 NOTE — Progress Notes (Signed)
Subjective:  Patient ID: Russell Adams, male    DOB: [birth year],  MRN: [id number]  Chief Complaint  Patient presents with  . Foot Problem    mri was turned down by insurance   47 y.o. male presents with the above complaint.  Following up for neuroma but states that his MRI was denied by insurance.  Still having pain on the right side.  States that he is having the pain daily.  Left side doing better.  Not having pain in the left side presently.  States that the left side is now no longer swelling like it was previously  Review of Systems: Negative except as noted in the HPI. Denies N/V/F/Ch.  Past Medical History:  Diagnosis Date  . Alcohol dependence ([place])   . Allergic rhinitis   . Arthritis   . Barrett esophagus   . BPH (benign prostatic hyperplasia)   . Chronic back pain   . Colon polyps   . Depression   . Diverticulitis   . Diverticulosis   . Fibromyalgia   . GERD (gastroesophageal reflux disease)   . Hiatal hernia   . History of alcohol abuse   . History of chicken pox   . Hypercholesterolemia   . Hyperglyceridemia   . Hypertension   . IBS (irritable bowel syndrome)   . Insomnia   . Migraines   . Neuropathy   . PTSD (post-traumatic stress disorder)   . Seizures ([place])   . Tobacco use     Current Outpatient Medications:  .  acetaminophen (TYLENOL) 500 MG tablet, Take 1,000 mg by mouth every 6 (six) hours as needed for mild pain., Disp: , Rfl:  .  Buprenorphine HCl-Naloxone HCl 4-1 MG FILM, Take 1 Film by mouth 3 (three) times daily., Disp: , Rfl:  .  cetirizine (ZYRTEC) 10 MG tablet, Take 10 mg by mouth daily. , Disp: , Rfl:  .  clonazePAM (KLONOPIN) 1 MG tablet, Take 1 mg by mouth 2 (two) times daily as needed for anxiety. , Disp: , Rfl:  .  fenofibrate (TRICOR) 145 MG tablet, Take 145 mg by mouth daily. , Disp: , Rfl:  .  gabapentin (NEURONTIN) 300 MG capsule, Take 300 mg by mouth 4 (four) times daily., Disp: , Rfl:  .  hydrochlorothiazide (HYDRODIURIL) 25 MG  tablet, Take 1 tablet by mouth daily., Disp: , Rfl:  .  omeprazole (PRILOSEC) 20 MG capsule, Take 20 mg by mouth 2 (two) times daily before a meal. , Disp: , Rfl:  .  sertraline (ZOLOFT) 100 MG tablet, Take 100 mg by mouth daily., Disp: , Rfl:   Social History   Tobacco Use  Smoking Status Current Every Day Smoker  . Packs/day: 0.50  . Years: 11.00  . Pack years: 5.50  . Types: Cigarettes  Smokeless Tobacco Never Used    Allergies  Allergen Reactions  . Vitamin B12 Anxiety and Shortness Of Breath    Pills  . Metaxalone Anxiety   Objective:  There were no vitals filed for this visit. There is no height or weight on file to calculate BMI. Constitutional Well developed. Well nourished.  Vascular Dorsalis pedis pulses palpable bilaterally. Posterior tibial pulses palpable bilaterally. Capillary refill normal to all digits.  No cyanosis or clubbing noted. Pedal hair growth normal.  Neurologic Normal speech. Oriented to person, place, and time. Epicritic sensation to light touch grossly present bilaterally.  Dermatologic Nails well groomed and normal in appearance. No open wounds. No skin lesions.  Orthopedic: Normal joint  ROM without pain or crepitus bilaterally. No visible deformities. No bony tenderness. Pain palpation about the third interspace right with positive Mulder's click No pain with patient about the third interspace left.   Radiographs: None Assessment:   1. Morton neuroma, left   2. Morton neuroma, right   3. Interdigital neuroma   4. Neuralgia and neuritis   5. Localized edema    Plan:  Patient was evaluated and treated and all questions answered.  Morton's neuroma left -Improved.  No injection today  Morton's neuroma right -Sclerosing injection with 4% alcohol administered today.  We will plan for injection every 2 weeks  Procedure: Neuroma Injection Location: Right 3rd interspace Skin Prep: Alcohol. Injectate: 1 cc 4% sclerosing  alcohol. Disposition: Patient tolerated procedure well. Injection site dressed with a band-aid.   Return in about 2 weeks (around 12/02/2017) for sclerosing injection.

## 2017-12-04 ENCOUNTER — Ambulatory Visit: Payer: Medicaid Other | Admitting: Podiatry

## 2017-12-04 DIAGNOSIS — G5761 Lesion of plantar nerve, right lower limb: Secondary | ICD-10-CM | POA: Diagnosis not present

## 2017-12-04 NOTE — Progress Notes (Signed)
Subjective:  Patient ID: RODOLPHE EDMONSTON, male    DOB: [birth year],  MRN: [id number]  Chief Complaint  Patient presents with  . Neuroma    2 week follow up left   47 y.o. male presents with the above complaint.  Following up for neuroma but states that his MRI was denied by insurance.  Still having pain on the right side.  States that he is having the pain daily.  Left side doing better.  Not having pain in the left side presently.  States that the left side is now no longer swelling like it was previously  Review of Systems: Negative except as noted in the HPI. Denies N/V/F/Ch.  Past Medical History:  Diagnosis Date  . Alcohol dependence ([place])   . Allergic rhinitis   . Arthritis   . Barrett esophagus   . BPH (benign prostatic hyperplasia)   . Chronic back pain   . Colon polyps   . Depression   . Diverticulitis   . Diverticulosis   . Fibromyalgia   . GERD (gastroesophageal reflux disease)   . Hiatal hernia   . History of alcohol abuse   . History of chicken pox   . Hypercholesterolemia   . Hyperglyceridemia   . Hypertension   . IBS (irritable bowel syndrome)   . Insomnia   . Migraines   . Neuropathy   . PTSD (post-traumatic stress disorder)   . Seizures ([place])   . Tobacco use     Current Outpatient Medications:  .  acetaminophen (TYLENOL) 500 MG tablet, Take 1,000 mg by mouth every 6 (six) hours as needed for mild pain., Disp: , Rfl:  .  Buprenorphine HCl-Naloxone HCl 4-1 MG FILM, Take 1 Film by mouth 3 (three) times daily., Disp: , Rfl:  .  cetirizine (ZYRTEC) 10 MG tablet, Take 10 mg by mouth daily. , Disp: , Rfl:  .  clonazePAM (KLONOPIN) 1 MG tablet, Take 1 mg by mouth 2 (two) times daily as needed for anxiety. , Disp: , Rfl:  .  fenofibrate (TRICOR) 145 MG tablet, Take 145 mg by mouth daily. , Disp: , Rfl:  .  gabapentin (NEURONTIN) 300 MG capsule, Take 300 mg by mouth 4 (four) times daily., Disp: , Rfl:  .  hydrochlorothiazide (HYDRODIURIL) 25 MG tablet, Take 1  tablet by mouth daily., Disp: , Rfl:  .  omeprazole (PRILOSEC) 20 MG capsule, Take 20 mg by mouth 2 (two) times daily before a meal. , Disp: , Rfl:  .  sertraline (ZOLOFT) 100 MG tablet, Take 100 mg by mouth daily., Disp: , Rfl:   Social History   Tobacco Use  Smoking Status Current Every Day Smoker  . Packs/day: 0.50  . Years: 11.00  . Pack years: 5.50  . Types: Cigarettes  Smokeless Tobacco Never Used    Allergies  Allergen Reactions  . Vitamin B12 Anxiety and Shortness Of Breath    Pills  . Metaxalone Anxiety   Objective:  There were no vitals filed for this visit. There is no height or weight on file to calculate BMI. Constitutional Well developed. Well nourished.  Vascular Dorsalis pedis pulses palpable bilaterally. Posterior tibial pulses palpable bilaterally. Capillary refill normal to all digits.  No cyanosis or clubbing noted. Pedal hair growth normal.  Neurologic Normal speech. Oriented to person, place, and time. Epicritic sensation to light touch grossly present bilaterally.  Dermatologic Nails well groomed and normal in appearance. No open wounds. No skin lesions.  Orthopedic: Normal joint ROM without  pain or crepitus bilaterally. No visible deformities. No bony tenderness. Pain palpation about the third interspace right with positive Mulder's click No pain with patient about the third interspace left.   Radiographs: None Assessment:   No diagnosis found. Plan:  Patient was evaluated and treated and all questions answered.  Morton's neuroma right -Sclerosing injection #2with 4% alcohol administered today.  We will plan for injection every 2 weeks  Procedure: Neurolysis Location: Right 3rd interspace Skin Prep: Alcohol. Injectate: 4% alcohol sclerosing injection. Disposition: Patient tolerated procedure well. Injection site dressed with a band-aid.  No follow-ups on file.

## 2017-12-04 NOTE — Progress Notes (Signed)
Erroneous encounter, please disregard

## 2017-12-18 ENCOUNTER — Ambulatory Visit: Payer: Medicaid Other | Admitting: Podiatry

## 2017-12-18 DIAGNOSIS — G5761 Lesion of plantar nerve, right lower limb: Secondary | ICD-10-CM | POA: Diagnosis not present

## 2017-12-18 NOTE — Progress Notes (Signed)
Subjective:  Patient ID: Russell Adams, male    DOB: [birth year],  MRN: [id number]  Chief Complaint  Patient presents with  . Foot Problem    Injections have helped, pt is coming in for another injection on right foot.   47 y.o. male presents with the above complaint.  States the injections have helped.  Here for repeat injection. Review of Systems: Negative except as noted in the HPI. Denies N/V/F/Ch.  Past Medical History:  Diagnosis Date  . Alcohol dependence ([place])   . Allergic rhinitis   . Arthritis   . Barrett esophagus   . BPH (benign prostatic hyperplasia)   . Chronic back pain   . Colon polyps   . Depression   . Diverticulitis   . Diverticulosis   . Fibromyalgia   . GERD (gastroesophageal reflux disease)   . Hiatal hernia   . History of alcohol abuse   . History of chicken pox   . Hypercholesterolemia   . Hyperglyceridemia   . Hypertension   . IBS (irritable bowel syndrome)   . Insomnia   . Migraines   . Neuropathy   . PTSD (post-traumatic stress disorder)   . Seizures ([place])   . Tobacco use     Current Outpatient Medications:  .  acetaminophen (TYLENOL) 500 MG tablet, Take 1,000 mg by mouth every 6 (six) hours as needed for mild pain., Disp: , Rfl:  .  Buprenorphine HCl-Naloxone HCl 4-1 MG FILM, Take 1 Film by mouth 3 (three) times daily., Disp: , Rfl:  .  cetirizine (ZYRTEC) 10 MG tablet, Take 10 mg by mouth daily. , Disp: , Rfl:  .  clonazePAM (KLONOPIN) 1 MG tablet, Take 1 mg by mouth 2 (two) times daily as needed for anxiety. , Disp: , Rfl:  .  fenofibrate (TRICOR) 145 MG tablet, Take 145 mg by mouth daily. , Disp: , Rfl:  .  gabapentin (NEURONTIN) 300 MG capsule, Take 300 mg by mouth 4 (four) times daily., Disp: , Rfl:  .  hydrochlorothiazide (HYDRODIURIL) 25 MG tablet, Take 1 tablet by mouth daily., Disp: , Rfl:  .  Omega-3 Fatty Acids (FISH OIL) 1000 MG CAPS, Take by mouth., Disp: , Rfl:  .  omeprazole (PRILOSEC) 20 MG capsule, Take 20 mg by mouth 2  (two) times daily before a meal. , Disp: , Rfl:  .  sertraline (ZOLOFT) 100 MG tablet, Take 100 mg by mouth daily., Disp: , Rfl:  .  tamsulosin (FLOMAX) 0.4 MG CAPS capsule, , Disp: , Rfl: 4  Social History   Tobacco Use  Smoking Status Current Every Day Smoker  . Packs/day: 0.50  . Years: 11.00  . Pack years: 5.50  . Types: Cigarettes  Smokeless Tobacco Never Used    Allergies  Allergen Reactions  . Vitamin B12 Anxiety and Shortness Of Breath    Pills  . Metaxalone Anxiety   Objective:  There were no vitals filed for this visit. There is no height or weight on file to calculate BMI. Constitutional Well developed. Well nourished.  Vascular Dorsalis pedis pulses palpable bilaterally. Posterior tibial pulses palpable bilaterally. Capillary refill normal to all digits.  No cyanosis or clubbing noted. Pedal hair growth normal.  Neurologic Normal speech. Oriented to person, place, and time. Epicritic sensation to light touch grossly present bilaterally.  Dermatologic Nails well groomed and normal in appearance. No open wounds. No skin lesions.  Orthopedic: Normal joint ROM without pain or crepitus bilaterally. No visible deformities. No bony tenderness. Pain  palpation about the third interspace right with positive Mulder's click No pain with patient about the third interspace left.   Radiographs: None Assessment:   1. Morton neuroma, right    Plan:  Patient was evaluated and treated and all questions answered.  Morton's neuroma right -Sclerosing injection #3 with 4% alcohol administered today.  We will plan for injection every 2 weeks  Procedure: Neuroma Injection Location: Right 3rd interspace Skin Prep: Alcohol. Injectate: 1.5 cc 4% sclerosing alcohol. Disposition: Patient tolerated procedure well. Injection site dressed with a band-aid.  Return in about 2 weeks (around 01/01/2018) for Sclerosing injeciton.

## 2018-01-01 ENCOUNTER — Ambulatory Visit: Payer: Medicaid Other | Admitting: Podiatry

## 2018-05-26 ENCOUNTER — Emergency Department (HOSPITAL_COMMUNITY)
Admission: EM | Admit: 2018-05-26 | Discharge: 2018-05-26 | Disposition: A | Discharge status: Home / Self Care | Payer: Medicaid Other | Attending: Emergency Medicine | Admitting: Emergency Medicine

## 2018-05-26 ENCOUNTER — Other Ambulatory Visit: Payer: Self-pay

## 2018-05-26 DIAGNOSIS — I10 Essential (primary) hypertension: Secondary | ICD-10-CM | POA: Diagnosis not present

## 2018-05-26 DIAGNOSIS — E7800 Pure hypercholesterolemia, unspecified: Secondary | ICD-10-CM | POA: Insufficient documentation

## 2018-05-26 DIAGNOSIS — F17210 Nicotine dependence, cigarettes, uncomplicated: Secondary | ICD-10-CM | POA: Diagnosis not present

## 2018-05-26 DIAGNOSIS — Z79899 Other long term (current) drug therapy: Secondary | ICD-10-CM | POA: Insufficient documentation

## 2018-05-26 DIAGNOSIS — K625 Hemorrhage of anus and rectum: Secondary | ICD-10-CM | POA: Insufficient documentation

## 2018-05-26 LAB — TYPE AND SCREEN
ABO/RH(D): O POS
Antibody Screen: NEGATIVE

## 2018-05-26 LAB — COMPREHENSIVE METABOLIC PANEL
ALBUMIN: 4.2 g/dL (ref 3.5–5.0)
ALT: 26 U/L (ref 0–44)
AST: 25 U/L (ref 15–41)
Alkaline Phosphatase: 72 U/L (ref 38–126)
Anion gap: 11 (ref 5–15)
BUN: 20 mg/dL (ref 6–20)
CHLORIDE: 103 mmol/L (ref 98–111)
CO2: 27 mmol/L (ref 22–32)
CREATININE: 1.16 mg/dL (ref 0.61–1.24)
Calcium: 9.5 mg/dL (ref 8.9–10.3)
GFR calc Af Amer: >60 mL/min (ref >60)
GFR calc non Af Amer: >60 mL/min (ref >60)
Glucose, Bld: 116 mg/dL — ABNORMAL HIGH (ref 70–99)
Potassium: 4.6 mmol/L (ref 3.5–5.1)
Sodium: 141 mmol/L (ref 135–145)
Total Bilirubin: 0.7 mg/dL (ref 0.3–1.2)
Total Protein: 7.5 g/dL (ref 6.5–8.1)

## 2018-05-26 LAB — CBC
HCT: 44.8 % (ref 39.0–52.0)
Hemoglobin: 14.8 g/dL (ref 13.0–17.0)
MCH: 28.6 pg (ref 26.0–34.0)
MCHC: 33 g/dL (ref 30.0–36.0)
MCV: 86.7 fL (ref 80.0–100.0)
Platelets: 226 10*3/uL (ref 150–400)
RBC: 5.17 MIL/uL (ref 4.22–5.81)
RDW: 13.8 % (ref 11.5–15.5)
WBC: 7.3 10*3/uL (ref 4.0–10.5)
nRBC: 0 % (ref 0.0–0.2)

## 2018-05-26 LAB — POC OCCULT BLOOD, ED: Fecal Occult Bld: NEGATIVE

## 2018-05-26 LAB — ABO/RH: ABO/RH(D): O POS

## 2018-05-26 NOTE — Discharge Instructions (Addendum)
Dear Russell Adams  You came to [place] with bright red blood in your stool. We have determined this is most likely due to diverticular bleed or internal hemorrhoids. Here are our recommendations for you at discharge:  Please follow up with your gastroenterologist at your earliest convenience. Please use your suppository for your hemorrhoids.  Thank you for choosing [facility]

## 2018-05-26 NOTE — ED Provider Notes (Signed)
[facility] EMERGENCY DEPARTMENT Provider Note   CSN: [id number] Arrival date & time: 05/26/18  1232    History   Chief Complaint Chief Complaint  Patient presents with  . Rectal Bleeding   HPI Russell Adams is a 48 y.o. male W/ PMH of Diverticulosis, PTSD, hiatal hernia, Fibromyalgia, and on suboxone presenting with BRBPR. He was in his usual state of health until 2 days ago when he noted bloody streak on his stool as well as frank blood in the toilet bowl. He was intially alarmed  He states the stool itself was of usual brown color and quality and endorsed no abdominal pain. He had prior history of hospitalization for about 4 years ago but he states he had significant pain that time and this event does not feel like it at all. Denies any rectal pain, nausea, vomiting, diarrhea. Denies any increase in stool frequency, light-headedness, or dizziness.     Past Medical History:  Diagnosis Date  . Alcohol dependence ([place])   . Allergic rhinitis   . Arthritis   . Barrett esophagus   . BPH (benign prostatic hyperplasia)   . Chronic back pain   . Colon polyps   . Depression   . Diverticulitis   . Diverticulosis   . Fibromyalgia   . GERD (gastroesophageal reflux disease)   . Hiatal hernia   . History of alcohol abuse   . History of chicken pox   . Hypercholesterolemia   . Hyperglyceridemia   . Hypertension   . IBS (irritable bowel syndrome)   . Insomnia   . Migraines   . Neuropathy   . PTSD (post-traumatic stress disorder)   . Seizures ([place])   . Tobacco use     Patient Active Problem List   Diagnosis Date Noted  . Allergic rhinitis 02/20/2017  . Major depression, recurrent, chronic ([place]) 02/20/2017  . Foot pain 02/20/2017  . History of avascular necrosis of capital femoral epiphysis 02/28/2015  . Diverticulitis of large intestine with perforation without bleeding   . Paresthesia 05/26/2014  . Midline low back pain without sciatica 05/26/2014  .  Spondylosis of lumbar region without myelopathy or radiculopathy 05/12/2014  . Barrett's esophagus without dysplasia 01/02/2014  . BPH (benign prostatic hyperplasia) 03/04/2013  . Cigarette nicotine dependence 01/28/2012  . Fibromyalgia 01/28/2012  . FATTY LIVER DISEASE 05/09/2009  . HYPERTRIGLYCERIDEMIA 05/02/2009  . Essential hypertension 04/18/2009  . CARPAL TUNNEL SYNDROME 03/06/2009  . HYPERCHOLESTEROLEMIA 12/24/2006  . POST TRAUMATIC STRESS SYNDROME 12/24/2006  . GERD 12/24/2006    Past Surgical History:  Procedure Laterality Date  . HERNIA REPAIR Right 20115/2017   inguinal x2     Home Medications    Prior to Admission medications   Medication Sig Start Date End Date Taking? Authorizing Provider  acetaminophen (TYLENOL) 500 MG tablet Take 1,000 mg by mouth every 6 (six) hours as needed for mild pain.    [provider]  Buprenorphine HCl-Naloxone HCl 4-1 MG FILM Take 1 Film by mouth 3 (three) times daily. 08/14/15   [provider]  cetirizine (ZYRTEC) 10 MG tablet Take 10 mg by mouth daily.     [provider]  clonazePAM (KLONOPIN) 1 MG tablet Take 1 mg by mouth 2 (two) times daily as needed for anxiety.  04/25/14   [provider]  fenofibrate (TRICOR) 145 MG tablet Take 145 mg by mouth daily.  03/27/14   [provider]  gabapentin (NEURONTIN) 300 MG capsule Take 300 mg by  mouth 4 (four) times daily.    [provider]  hydrochlorothiazide (HYDRODIURIL) 25 MG tablet Take 1 tablet by mouth daily. 11/17/16   [provider]  Omega-3 Fatty Acids (FISH OIL) 1000 MG CAPS Take by mouth.    [provider]  omeprazole (PRILOSEC) 20 MG capsule Take 20 mg by mouth 2 (two) times daily before a meal.  09/13/13   [provider]  sertraline (ZOLOFT) 100 MG tablet Take 100 mg by mouth daily.    [provider]  tamsulosin (FLOMAX) 0.4 MG CAPS capsule  12/03/17   [provider]   Family  History Family History  Problem Relation Age of Onset  . Hypertension Mother   . Heart attack Father   . Diabetes Father   . Alcoholism Father   . Drug abuse Father   . Diabetes Sister   . Diabetes Sister    Social History Social History   Tobacco Use  . Smoking status: Current Every Day Smoker    Packs/day: 0.50    Years: 11.00    Pack years: 5.50    Types: Cigarettes  . Smokeless tobacco: Never Used  Substance Use Topics  . Alcohol use: No    Alcohol/week: 42.0 standard drinks    Types: 42 Cans of beer per week    Frequency: Never    Comment: Six pack beer/day/ quit 2017  . Drug use: No    Types: Marijuana   Allergies   Vitamin b12 and Metaxalone   Review of Systems Review of Systems  Constitutional: Negative for chills, fatigue and fever.  Cardiovascular: Negative for chest pain, palpitations and leg swelling.  Gastrointestinal: Positive for anal bleeding and blood in stool. Negative for abdominal distention, abdominal pain, diarrhea, nausea, rectal pain and vomiting.  Genitourinary: Negative for dysuria, flank pain, frequency, hematuria and urgency.  Skin: Negative for pallor.  Neurological: Negative for dizziness, light-headedness and headaches.     Physical Exam Updated Vital Signs BP 132/85 (BP Location: Right Arm)   Pulse 80   Temp 97.8 F (36.6 C) (Oral)   Resp 16   Ht 6' (1.829 m)   Wt 88.9 kg   SpO2 97%   BMI 26.58 kg/m   Physical Exam Constitutional:      General: He is not in acute distress.    Appearance: Normal appearance. He is normal weight.  Eyes:     Conjunctiva/sclera: Conjunctivae normal.     Comments: No conjunctival pallor  Cardiovascular:     Rate and Rhythm: Normal rate and regular rhythm.     Pulses: Normal pulses.     Heart sounds: Normal heart sounds.  Pulmonary:     Effort: Pulmonary effort is normal.     Breath sounds: Normal breath sounds. No wheezing or rales.  Abdominal:     General: Abdomen is flat. Bowel  sounds are normal. There is distension.     Palpations: Abdomen is soft.     Tenderness: There is no abdominal tenderness. There is no guarding.     Hernia: No hernia is present.     Comments: Well healed surgical scars  Genitourinary:    Prostate: Normal.     Rectum: Normal.     Comments: No obvious palpable hemorrhoids on rectal exam. No fissures, ulcers, frank bleed. No stool in rectal vault. Musculoskeletal: Normal range of motion.     Right lower leg: No edema.     Left lower leg: No edema.  Skin:  General: Skin is warm and dry.     Coloration: Skin is not jaundiced or pale.  Neurological:     Mental Status: He is alert.      ED Treatments / Results  Labs (all labs ordered are listed, but only abnormal results are displayed) Labs Reviewed  COMPREHENSIVE METABOLIC PANEL - Abnormal; Notable for the following components:      Result Value   Glucose, Bld 116 (*)    All other components within normal limits  CBC  POC OCCULT BLOOD, ED  TYPE AND SCREEN  ABO/RH   EKG None  Radiology No results found.  Procedures Procedures (including critical care time)  Medications Ordered in ED Medications - No data to display   Initial Impression / Assessment and Plan / ED Course  I have reviewed the triage vital signs and the nursing notes.  Pertinent labs & imaging results that were available during my care of the patient were reviewed by me and considered in my medical decision making (see chart for details).    Russell Adams is a 48 yo M w/ pmh of chronic opioid use and diverticulosis presenting with BRBPR. His bleeding may have been caused by diverticular bleed or internal hemorrhoids. He states he already has suppositories for hemorrhoids at home. Current hemoglobin and vitals stable. Will discharge home with recommendation to f/u w/ his primary gastroenterologist.  Final Clinical Impressions(s) / ED Diagnoses   Final diagnoses:  None    ED Discharge Orders    None         Mosetta Anis, MD 05/26/18 1605    Blanchie Dessert, MD 05/26/18 2134

## 2018-05-26 NOTE — ED Notes (Signed)
Patient verbalizes understanding of discharge instructions. Opportunity for questioning and answers were provided. Armband removed by staff, pt discharged from ED ambulatory to home.  

## 2018-05-26 NOTE — ED Triage Notes (Signed)
Bright red rectal bleeding, started 3 days ago. Hx of diverticulitis, no abd pain at present.

## 2018-06-17 MED FILL — CETIRIZINE HCL 10 MG TABS: 10 | 30 days supply | Qty: 30 | Fill #0

## 2018-06-17 MED FILL — OMEPRAZOLE 20 MG CPDR: 20 | 30 days supply | Qty: 60 | Fill #0

## 2018-06-26 MED FILL — HYDROCHLOROTHIAZIDE 25 MG T: 25 | 30 days supply | Qty: 30 | Fill #0

## 2018-06-26 MED FILL — GABAPENTIN 300 MG CAPSULE: 300 | 30 days supply | Qty: 180 | Fill #0

## 2018-07-02 MED FILL — SERTRALINE HCL 100 MG TAB: 100 | 30 days supply | Qty: 60 | Fill #0

## 2018-07-05 MED FILL — OMEGA-3 ETHYL ESTER 1 GM CA: 1 | 30 days supply | Qty: 30 | Fill #0

## 2018-07-05 MED FILL — FENOFIBRATE 145 MG TABS: 145 | 30 days supply | Qty: 30 | Fill #0

## 2018-07-08 MED FILL — busPIRone HCL 10 MG TABS: 10 | 30 days supply | Qty: 90 | Fill #0

## 2018-07-14 MED FILL — CETIRIZINE HCL 10 MG TABS: 10 | 30 days supply | Qty: 30 | Fill #1

## 2018-07-14 MED FILL — OMEPRAZOLE 20 MG CPDR: 20 | 30 days supply | Qty: 60 | Fill #1

## 2018-07-24 MED FILL — HYDROCHLOROTHIAZIDE 25 MG T: 25 | 30 days supply | Qty: 30 | Fill #1

## 2018-07-27 MED FILL — GABAPENTIN 300 MG CAPSULE: 300 | 30 days supply | Qty: 180 | Fill #0

## 2018-07-30 MED FILL — FENOFIBRATE 145 MG TABS: 145 | 30 days supply | Qty: 30 | Fill #1

## 2018-08-02 MED FILL — OMEGA-3 ETHYL ESTER 1 GM CA: 1 | 30 days supply | Qty: 30 | Fill #1

## 2018-08-02 MED FILL — SERTRALINE HCL 100 MG TAB: 100 | 90 days supply | Qty: 180 | Fill #0

## 2018-08-02 MED FILL — busPIRone HCL 10 MG TABS: 10 | 90 days supply | Qty: 270 | Fill #0

## 2018-08-09 MED FILL — CETIRIZINE HCL 10 MG TABS: 10 | 30 days supply | Qty: 30 | Fill #2

## 2018-08-09 MED FILL — OMEPRAZOLE 20 MG CPDR: 20 | 30 days supply | Qty: 60 | Fill #0

## 2018-08-21 MED FILL — HYDROCHLOROTHIAZIDE 25 MG T: 25 | 30 days supply | Qty: 30 | Fill #2

## 2018-08-26 MED FILL — GABAPENTIN 300 MG CAPSULE: 300 | 30 days supply | Qty: 180 | Fill #0

## 2018-08-31 MED FILL — OMEGA-3 ETHYL ESTER 1 GM CA: 1 | 30 days supply | Qty: 30 | Fill #2

## 2018-09-15 ENCOUNTER — Encounter: Payer: Self-pay | Admitting: Specialist

## 2018-09-15 MED FILL — CETIRIZINE HCL 10 MG TABS: 10 | 30 days supply | Qty: 30 | Fill #3

## 2018-09-15 MED FILL — OMEPRAZOLE 20 MG CPDR: 20 | 30 days supply | Qty: 60 | Fill #1

## 2018-09-16 MED FILL — FENOFIBRATE 145 MG TABS: 145 | 30 days supply | Qty: 30 | Fill #0

## 2019-10-03 ENCOUNTER — Other Ambulatory Visit (HOSPITAL_COMMUNITY): Payer: Self-pay | Admitting: Family Medicine

## 2019-11-07 ENCOUNTER — Other Ambulatory Visit (HOSPITAL_COMMUNITY): Payer: Self-pay | Admitting: Physician Assistant

## 2019-11-09 ENCOUNTER — Other Ambulatory Visit (HOSPITAL_COMMUNITY): Payer: Self-pay

## 2019-12-05 ENCOUNTER — Other Ambulatory Visit (HOSPITAL_COMMUNITY): Payer: Self-pay | Admitting: Urology

## 2019-12-13 ENCOUNTER — Other Ambulatory Visit: Payer: Self-pay | Admitting: Nephrology

## 2019-12-13 DIAGNOSIS — N1832 Chronic kidney disease, stage 3b: Secondary | ICD-10-CM

## 2019-12-20 ENCOUNTER — Ambulatory Visit
Admission: RE | Admit: 2019-12-20 | Discharge: 2019-12-20 | Disposition: A | Discharge status: Home / Self Care | Payer: Medicaid Other | Source: Ambulatory Visit | Attending: Nephrology | Admitting: Nephrology

## 2019-12-20 DIAGNOSIS — N1832 Chronic kidney disease, stage 3b: Secondary | ICD-10-CM

## 2020-01-03 ENCOUNTER — Other Ambulatory Visit (HOSPITAL_COMMUNITY): Payer: Self-pay | Admitting: Family Medicine

## 2020-01-27 ENCOUNTER — Other Ambulatory Visit (HOSPITAL_COMMUNITY): Payer: Self-pay | Admitting: Nurse Practitioner

## 2020-02-01 ENCOUNTER — Other Ambulatory Visit (HOSPITAL_COMMUNITY): Payer: Self-pay

## 2020-02-22 ENCOUNTER — Other Ambulatory Visit (HOSPITAL_COMMUNITY): Payer: Self-pay | Admitting: Nurse Practitioner

## 2020-03-05 ENCOUNTER — Other Ambulatory Visit (HOSPITAL_COMMUNITY): Payer: Self-pay | Admitting: Family Medicine

## 2020-03-26 ENCOUNTER — Other Ambulatory Visit (HOSPITAL_COMMUNITY): Payer: Self-pay | Admitting: Nurse Practitioner

## 2020-04-02 ENCOUNTER — Other Ambulatory Visit (HOSPITAL_COMMUNITY): Payer: Self-pay | Admitting: Urology

## 2020-04-11 ENCOUNTER — Other Ambulatory Visit (HOSPITAL_COMMUNITY): Payer: Self-pay

## 2020-04-24 ENCOUNTER — Other Ambulatory Visit (HOSPITAL_COMMUNITY): Payer: Self-pay | Admitting: Nurse Practitioner

## 2020-05-23 ENCOUNTER — Other Ambulatory Visit (HOSPITAL_COMMUNITY): Payer: Self-pay | Admitting: Family Medicine

## 2020-06-04 ENCOUNTER — Other Ambulatory Visit (HOSPITAL_COMMUNITY): Payer: Self-pay | Admitting: Urology

## 2020-06-15 ENCOUNTER — Other Ambulatory Visit (HOSPITAL_COMMUNITY): Payer: Self-pay | Admitting: Family Medicine

## 2020-06-22 ENCOUNTER — Other Ambulatory Visit (HOSPITAL_COMMUNITY): Payer: Self-pay | Admitting: Nurse Practitioner

## 2020-07-02 ENCOUNTER — Other Ambulatory Visit (HOSPITAL_COMMUNITY): Payer: Self-pay

## 2020-07-02 MED ORDER — BUSPIRONE HCL 15 MG PO TABS
15.0000 mg | ORAL_TABLET | Freq: Three times a day (TID) | ORAL | 2 refills | Status: AC
  Filled 2020-07-02: qty 90, 30d supply, fill #0

## 2020-07-02 MED ORDER — GABAPENTIN 300 MG PO CAPS
2.0000 | ORAL_CAPSULE | Freq: Three times a day (TID) | ORAL | 2 refills | Status: AC
  Filled 2020-07-02: qty 180, 30d supply, fill #0
  Filled 2020-09-03: qty 180, 30d supply, fill #1

## 2020-07-02 MED ORDER — SERTRALINE HCL 100 MG PO TABS
ORAL_TABLET | ORAL | 0 refills | Status: DC
  Filled 2020-07-02: qty 180, 30d supply, fill #0

## 2020-07-03 ENCOUNTER — Other Ambulatory Visit (HOSPITAL_COMMUNITY): Payer: Self-pay

## 2020-07-05 ENCOUNTER — Other Ambulatory Visit (HOSPITAL_COMMUNITY): Payer: Self-pay

## 2020-07-05 MED FILL — Tamsulosin HCl Cap 0.4 MG: ORAL | 30 days supply | Qty: 60 | Fill #0 | Status: AC

## 2020-07-20 ENCOUNTER — Other Ambulatory Visit (HOSPITAL_COMMUNITY): Payer: Self-pay

## 2020-07-20 MED ORDER — BUPRENORPHINE HCL-NALOXONE HCL 8-2 MG SL FILM
1.0000 | ORAL_FILM | Freq: Three times a day (TID) | SUBLINGUAL | 0 refills | Status: AC
  Filled 2020-07-20 (×2): qty 90, 30d supply, fill #0

## 2020-07-20 MED ORDER — BUPRENORPHINE HCL-NALOXONE HCL 8-2 MG SL FILM
ORAL_FILM | SUBLINGUAL | 0 refills | Status: AC
  Filled 2020-07-20: qty 90, 30d supply, fill #0

## 2020-08-02 ENCOUNTER — Other Ambulatory Visit (HOSPITAL_COMMUNITY): Payer: Self-pay

## 2020-08-02 ENCOUNTER — Other Ambulatory Visit: Payer: Self-pay | Admitting: Urology

## 2020-08-02 MED ORDER — OMEPRAZOLE 20 MG PO CPDR
1.0000 | DELAYED_RELEASE_CAPSULE | Freq: Two times a day (BID) | ORAL | 1 refills | Status: DC
  Filled 2020-08-02: qty 180, 90d supply, fill #0
  Filled 2020-11-11: qty 180, 90d supply, fill #1
  Filled 2020-11-12: qty 180, 90d supply, fill #0

## 2020-08-02 MED FILL — Gabapentin Cap 300 MG: ORAL | 30 days supply | Qty: 180 | Fill #0 | Status: AC

## 2020-08-02 MED FILL — Fenofibrate Tab 145 MG: ORAL | 90 days supply | Qty: 90 | Fill #0 | Status: AC

## 2020-08-03 ENCOUNTER — Other Ambulatory Visit (HOSPITAL_COMMUNITY): Payer: Self-pay

## 2020-08-03 MED ORDER — TAMSULOSIN HCL 0.4 MG PO CAPS
ORAL_CAPSULE | Freq: Two times a day (BID) | ORAL | 1 refills | Status: AC
  Filled 2020-08-03: qty 60, 30d supply, fill #0
  Filled 2020-09-03: qty 60, 30d supply, fill #1

## 2020-08-07 ENCOUNTER — Other Ambulatory Visit (HOSPITAL_COMMUNITY): Payer: Self-pay

## 2020-08-17 ENCOUNTER — Other Ambulatory Visit (HOSPITAL_COMMUNITY): Payer: Self-pay

## 2020-08-17 MED ORDER — BUPRENORPHINE HCL-NALOXONE HCL 8-2 MG SL FILM
ORAL_FILM | SUBLINGUAL | 0 refills | Status: AC
  Filled 2020-08-17: qty 90, 30d supply, fill #0

## 2020-09-03 ENCOUNTER — Other Ambulatory Visit (HOSPITAL_COMMUNITY): Payer: Self-pay

## 2020-09-03 MED FILL — Omega-3-acid Ethyl Esters Cap 1 GM: ORAL | 90 days supply | Qty: 90 | Fill #0 | Status: AC

## 2020-09-03 MED FILL — Losartan Potassium Tab 50 MG: ORAL | 90 days supply | Qty: 90 | Fill #0 | Status: AC

## 2020-09-13 ENCOUNTER — Other Ambulatory Visit (HOSPITAL_COMMUNITY): Payer: Self-pay

## 2020-09-13 MED FILL — Atorvastatin Calcium Tab 10 MG (Base Equivalent): ORAL | 90 days supply | Qty: 90 | Fill #0 | Status: AC

## 2020-09-14 ENCOUNTER — Other Ambulatory Visit (HOSPITAL_COMMUNITY): Payer: Self-pay

## 2020-09-17 ENCOUNTER — Other Ambulatory Visit (HOSPITAL_COMMUNITY): Payer: Self-pay

## 2020-09-17 MED ORDER — BUPRENORPHINE HCL-NALOXONE HCL 8-2 MG SL FILM
ORAL_FILM | SUBLINGUAL | 0 refills | Status: AC
  Filled 2020-09-17: qty 90, 30d supply, fill #0

## 2020-09-25 ENCOUNTER — Other Ambulatory Visit (HOSPITAL_COMMUNITY): Payer: Self-pay

## 2020-09-26 ENCOUNTER — Other Ambulatory Visit (HOSPITAL_COMMUNITY): Payer: Self-pay

## 2020-09-26 MED ORDER — BUSPIRONE HCL 15 MG PO TABS
15.0000 mg | ORAL_TABLET | Freq: Three times a day (TID) | ORAL | 2 refills | Status: AC
  Filled 2020-09-26: qty 90, 30d supply, fill #0

## 2020-09-26 MED ORDER — GABAPENTIN 300 MG PO CAPS
600.0000 mg | ORAL_CAPSULE | Freq: Three times a day (TID) | ORAL | 2 refills | Status: AC
  Filled 2020-09-26: qty 180, 30d supply, fill #0
  Filled 2020-10-04: qty 150, 25d supply, fill #0
  Filled 2020-11-11: qty 150, 25d supply, fill #1
  Filled 2020-11-12: qty 180, 30d supply, fill #0
  Filled 2020-12-10: qty 180, 30d supply, fill #1
  Filled 2021-01-07: qty 180, 30d supply, fill #2

## 2020-09-26 MED ORDER — SERTRALINE HCL 100 MG PO TABS
ORAL_TABLET | ORAL | 0 refills | Status: DC
  Filled 2020-09-26: qty 180, 90d supply, fill #0

## 2020-10-04 ENCOUNTER — Other Ambulatory Visit (HOSPITAL_COMMUNITY): Payer: Self-pay

## 2020-10-04 MED ORDER — PROAIR HFA 108 (90 BASE) MCG/ACT IN AERS
INHALATION_SPRAY | RESPIRATORY_TRACT | 1 refills | Status: DC
  Filled 2020-10-04: qty 8.5, 17d supply, fill #0
  Filled 2020-10-20: qty 8.5, 17d supply, fill #1
  Filled 2020-10-23 – 2020-12-19 (×3): qty 8.5, 17d supply, fill #0

## 2020-10-08 ENCOUNTER — Other Ambulatory Visit (HOSPITAL_COMMUNITY): Payer: Self-pay

## 2020-10-08 ENCOUNTER — Other Ambulatory Visit: Payer: Self-pay

## 2020-10-12 ENCOUNTER — Other Ambulatory Visit (HOSPITAL_COMMUNITY): Payer: Self-pay

## 2020-10-15 ENCOUNTER — Other Ambulatory Visit (HOSPITAL_COMMUNITY): Payer: Self-pay

## 2020-10-15 MED ORDER — BUPRENORPHINE HCL-NALOXONE HCL 8-2 MG SL FILM
ORAL_FILM | SUBLINGUAL | 0 refills | Status: AC
  Filled 2020-10-15 – 2020-10-16 (×3): qty 90, 30d supply, fill #0

## 2020-10-16 ENCOUNTER — Other Ambulatory Visit (HOSPITAL_COMMUNITY): Payer: Self-pay

## 2020-10-23 ENCOUNTER — Other Ambulatory Visit (HOSPITAL_COMMUNITY): Payer: Self-pay

## 2020-10-23 MED ORDER — ZTLIDO 1.8 % EX PTCH
MEDICATED_PATCH | CUTANEOUS | 2 refills | Status: DC
  Filled 2020-10-23 – 2020-10-25 (×2): qty 30, 30d supply, fill #0
  Filled 2020-11-21: qty 30, 30d supply, fill #1
  Filled 2020-12-26: qty 30, 30d supply, fill #2

## 2020-10-24 ENCOUNTER — Other Ambulatory Visit (HOSPITAL_COMMUNITY): Payer: Self-pay

## 2020-10-25 ENCOUNTER — Other Ambulatory Visit (HOSPITAL_COMMUNITY): Payer: Self-pay

## 2020-10-26 ENCOUNTER — Other Ambulatory Visit (HOSPITAL_COMMUNITY): Payer: Self-pay

## 2020-11-11 MED FILL — Fenofibrate Tab 145 MG: ORAL | 90 days supply | Qty: 90 | Fill #1 | Status: CN

## 2020-11-12 ENCOUNTER — Other Ambulatory Visit (HOSPITAL_COMMUNITY): Payer: Self-pay

## 2020-11-12 MED FILL — Fenofibrate Tab 145 MG: ORAL | 90 days supply | Qty: 90 | Fill #0 | Status: AC

## 2020-11-14 ENCOUNTER — Other Ambulatory Visit (HOSPITAL_COMMUNITY): Payer: Self-pay

## 2020-11-14 MED ORDER — BUPRENORPHINE HCL-NALOXONE HCL 8-2 MG SL FILM
1.0000 | ORAL_FILM | Freq: Three times a day (TID) | SUBLINGUAL | 0 refills | Status: AC
  Filled 2020-11-14: qty 90, 30d supply, fill #0

## 2020-11-22 ENCOUNTER — Other Ambulatory Visit (HOSPITAL_COMMUNITY): Payer: Self-pay

## 2020-12-03 ENCOUNTER — Other Ambulatory Visit (HOSPITAL_COMMUNITY): Payer: Self-pay

## 2020-12-04 ENCOUNTER — Other Ambulatory Visit (HOSPITAL_COMMUNITY): Payer: Self-pay

## 2020-12-04 MED ORDER — LOSARTAN POTASSIUM 50 MG PO TABS
50.0000 mg | ORAL_TABLET | Freq: Every day | ORAL | 1 refills | Status: DC
  Filled 2020-12-04 – 2021-03-01 (×2): qty 90, 90d supply, fill #0

## 2020-12-10 ENCOUNTER — Other Ambulatory Visit (HOSPITAL_COMMUNITY): Payer: Self-pay

## 2020-12-10 MED FILL — Atorvastatin Calcium Tab 10 MG (Base Equivalent): ORAL | 90 days supply | Qty: 90 | Fill #1 | Status: CN

## 2020-12-12 ENCOUNTER — Other Ambulatory Visit (HOSPITAL_COMMUNITY): Payer: Self-pay

## 2020-12-12 MED FILL — Atorvastatin Calcium Tab 10 MG (Base Equivalent): ORAL | 90 days supply | Qty: 90 | Fill #0 | Status: AC

## 2020-12-13 ENCOUNTER — Other Ambulatory Visit (HOSPITAL_COMMUNITY): Payer: Self-pay

## 2020-12-13 MED ORDER — BUPRENORPHINE HCL-NALOXONE HCL 8-2 MG SL FILM
1.0000 | ORAL_FILM | Freq: Three times a day (TID) | SUBLINGUAL | 0 refills | Status: AC
  Filled 2020-12-13: qty 90, 30d supply, fill #0

## 2020-12-13 MED ORDER — NALOXONE HCL 4 MG/0.1ML NA LIQD
1.0000 | NASAL | 5 refills | Status: AC
  Filled 2020-12-13: qty 2, 1d supply, fill #0

## 2020-12-17 ENCOUNTER — Other Ambulatory Visit (HOSPITAL_COMMUNITY): Payer: Self-pay

## 2020-12-17 MED ORDER — OMEGA-3-ACID ETHYL ESTERS 1 G PO CAPS
1.0000 g | ORAL_CAPSULE | Freq: Every day | ORAL | 1 refills | Status: DC
  Filled 2020-12-17 – 2020-12-19 (×2): qty 90, 90d supply, fill #0
  Filled 2021-03-10: qty 90, 90d supply, fill #1

## 2020-12-19 ENCOUNTER — Other Ambulatory Visit (HOSPITAL_COMMUNITY): Payer: Self-pay

## 2020-12-19 MED ORDER — GABAPENTIN 300 MG PO CAPS
600.0000 mg | ORAL_CAPSULE | Freq: Three times a day (TID) | ORAL | 2 refills | Status: AC
  Filled 2020-12-19 – 2021-01-10 (×3): qty 180, 30d supply, fill #0
  Filled 2021-02-11: qty 180, 30d supply, fill #1
  Filled 2021-03-10: qty 180, 30d supply, fill #2

## 2020-12-19 MED ORDER — BUSPIRONE HCL 15 MG PO TABS
15.0000 mg | ORAL_TABLET | Freq: Three times a day (TID) | ORAL | 2 refills | Status: AC
  Filled 2020-12-19: qty 90, 30d supply, fill #0

## 2020-12-19 MED ORDER — SERTRALINE HCL 100 MG PO TABS
200.0000 mg | ORAL_TABLET | Freq: Every morning | ORAL | 0 refills | Status: DC
  Filled 2020-12-19: qty 180, 90d supply, fill #0

## 2020-12-23 MED FILL — Diclofenac Sodium Gel 1% (1.16% Diethylamine Equiv): CUTANEOUS | Qty: 100 | Fill #0 | Status: CN

## 2020-12-24 ENCOUNTER — Other Ambulatory Visit (HOSPITAL_COMMUNITY): Payer: Self-pay

## 2020-12-24 MED FILL — Diclofenac Sodium Gel 1% (1.16% Diethylamine Equiv): CUTANEOUS | 30 days supply | Qty: 100 | Fill #0 | Status: AC

## 2020-12-26 ENCOUNTER — Other Ambulatory Visit (HOSPITAL_COMMUNITY): Payer: Self-pay

## 2021-01-07 ENCOUNTER — Other Ambulatory Visit (HOSPITAL_COMMUNITY): Payer: Self-pay

## 2021-01-10 ENCOUNTER — Other Ambulatory Visit (HOSPITAL_COMMUNITY): Payer: Self-pay

## 2021-01-10 MED ORDER — BUPRENORPHINE HCL-NALOXONE HCL 8-2 MG SL FILM
1.0000 | ORAL_FILM | Freq: Three times a day (TID) | SUBLINGUAL | 0 refills | Status: DC
  Filled 2021-01-10: qty 90, 30d supply, fill #0

## 2021-01-10 MED ORDER — ZTLIDO 1.8 % EX PTCH
1.0000 | MEDICATED_PATCH | Freq: Every day | CUTANEOUS | 11 refills | Status: AC
  Filled 2021-01-10 – 2021-01-27 (×2): qty 30, 30d supply, fill #0
  Filled 2021-02-25: qty 30, 30d supply, fill #1
  Filled 2021-03-25: qty 30, 30d supply, fill #2
  Filled 2021-04-23: qty 30, 30d supply, fill #3
  Filled 2021-05-24: qty 30, 30d supply, fill #4
  Filled 2021-06-24: qty 30, 30d supply, fill #5
  Filled 2021-08-27: qty 30, 30d supply, fill #6

## 2021-01-28 ENCOUNTER — Other Ambulatory Visit (HOSPITAL_COMMUNITY): Payer: Self-pay

## 2021-02-07 ENCOUNTER — Other Ambulatory Visit (HOSPITAL_COMMUNITY): Payer: Self-pay

## 2021-02-07 MED ORDER — OMEPRAZOLE 20 MG PO CPDR
20.0000 mg | DELAYED_RELEASE_CAPSULE | Freq: Two times a day (BID) | ORAL | 0 refills | Status: DC
  Filled 2021-02-07: qty 180, 90d supply, fill #0

## 2021-02-11 ENCOUNTER — Other Ambulatory Visit (HOSPITAL_COMMUNITY): Payer: Self-pay

## 2021-02-11 MED ORDER — FENOFIBRATE 145 MG PO TABS
145.0000 mg | ORAL_TABLET | Freq: Every day | ORAL | 1 refills | Status: DC
  Filled 2021-02-11: qty 90, 90d supply, fill #0
  Filled 2021-05-09: qty 90, 90d supply, fill #1

## 2021-02-12 ENCOUNTER — Other Ambulatory Visit (HOSPITAL_COMMUNITY): Payer: Self-pay

## 2021-02-12 MED ORDER — BUPRENORPHINE HCL-NALOXONE HCL 8-2 MG SL FILM
1.0000 | ORAL_FILM | Freq: Three times a day (TID) | SUBLINGUAL | 0 refills | Status: DC
  Filled 2021-02-12: qty 90, 30d supply, fill #0

## 2021-02-15 ENCOUNTER — Other Ambulatory Visit (HOSPITAL_COMMUNITY): Payer: Self-pay

## 2021-02-25 ENCOUNTER — Other Ambulatory Visit (HOSPITAL_COMMUNITY): Payer: Self-pay

## 2021-02-25 MED ORDER — PROAIR HFA 108 (90 BASE) MCG/ACT IN AERS
INHALATION_SPRAY | RESPIRATORY_TRACT | 1 refills | Status: AC
  Filled 2021-02-25: qty 8.5, 25d supply, fill #0

## 2021-02-25 MED ORDER — ALBUTEROL SULFATE HFA 108 (90 BASE) MCG/ACT IN AERS
1.0000 | INHALATION_SPRAY | RESPIRATORY_TRACT | 1 refills | Status: DC
Start: 2021-02-25 — End: 2021-08-16
  Filled 2021-02-25: qty 18, 17d supply, fill #0
  Filled 2021-04-11: qty 18, 17d supply, fill #1

## 2021-02-26 ENCOUNTER — Other Ambulatory Visit (HOSPITAL_COMMUNITY): Payer: Self-pay

## 2021-03-01 ENCOUNTER — Other Ambulatory Visit (HOSPITAL_COMMUNITY): Payer: Self-pay

## 2021-03-01 MED ORDER — BUSPIRONE HCL 15 MG PO TABS
15.0000 mg | ORAL_TABLET | Freq: Three times a day (TID) | ORAL | 3 refills | Status: AC
  Filled 2021-03-01: qty 90, 30d supply, fill #0
  Filled 2021-05-09: qty 90, 30d supply, fill #1

## 2021-03-01 MED ORDER — GABAPENTIN 300 MG PO CAPS
600.0000 mg | ORAL_CAPSULE | Freq: Three times a day (TID) | ORAL | 3 refills | Status: AC
  Filled 2021-03-01 – 2021-04-11 (×2): qty 180, 30d supply, fill #0
  Filled 2021-05-09: qty 180, 30d supply, fill #1
  Filled 2021-06-06: qty 180, 30d supply, fill #2
  Filled 2021-07-07: qty 180, 30d supply, fill #3

## 2021-03-01 MED ORDER — SERTRALINE HCL 100 MG PO TABS
200.0000 mg | ORAL_TABLET | Freq: Every morning | ORAL | 0 refills | Status: DC
  Filled 2021-03-01 – 2021-04-11 (×2): qty 180, 90d supply, fill #0

## 2021-03-09 ENCOUNTER — Encounter (HOSPITAL_BASED_OUTPATIENT_CLINIC_OR_DEPARTMENT_OTHER): Payer: Self-pay | Admitting: Emergency Medicine

## 2021-03-09 ENCOUNTER — Other Ambulatory Visit: Payer: Self-pay

## 2021-03-09 ENCOUNTER — Emergency Department (HOSPITAL_BASED_OUTPATIENT_CLINIC_OR_DEPARTMENT_OTHER): Payer: Medicaid Other

## 2021-03-09 ENCOUNTER — Emergency Department (HOSPITAL_BASED_OUTPATIENT_CLINIC_OR_DEPARTMENT_OTHER)
Admission: EM | Admit: 2021-03-09 | Discharge: 2021-03-09 | Disposition: A | Discharge status: Home / Self Care | Payer: Medicaid Other | Attending: Emergency Medicine | Admitting: Emergency Medicine

## 2021-03-09 DIAGNOSIS — H539 Unspecified visual disturbance: Secondary | ICD-10-CM

## 2021-03-09 DIAGNOSIS — Z79899 Other long term (current) drug therapy: Secondary | ICD-10-CM | POA: Diagnosis not present

## 2021-03-09 DIAGNOSIS — I10 Essential (primary) hypertension: Secondary | ICD-10-CM | POA: Diagnosis not present

## 2021-03-09 DIAGNOSIS — R42 Dizziness and giddiness: Secondary | ICD-10-CM | POA: Insufficient documentation

## 2021-03-09 DIAGNOSIS — H538 Other visual disturbances: Secondary | ICD-10-CM | POA: Diagnosis not present

## 2021-03-09 DIAGNOSIS — F17210 Nicotine dependence, cigarettes, uncomplicated: Secondary | ICD-10-CM | POA: Insufficient documentation

## 2021-03-09 LAB — CBC WITH DIFFERENTIAL/PLATELET
Abs Immature Granulocytes: 0.01 10*3/uL (ref 0.00–0.07)
Basophils Absolute: 0 10*3/uL (ref 0.0–0.1)
Basophils Relative: 1 %
Eosinophils Absolute: 0.1 10*3/uL (ref 0.0–0.5)
Eosinophils Relative: 2 %
HCT: 38.3 % — ABNORMAL LOW (ref 39.0–52.0)
Hemoglobin: 12.3 g/dL — ABNORMAL LOW (ref 13.0–17.0)
Immature Granulocytes: 0 %
Lymphocytes Relative: 37 %
Lymphs Abs: 1.9 10*3/uL (ref 0.7–4.0)
MCH: 28.7 pg (ref 26.0–34.0)
MCHC: 32.1 g/dL (ref 30.0–36.0)
MCV: 89.5 fL (ref 80.0–100.0)
Monocytes Absolute: 0.5 10*3/uL (ref 0.1–1.0)
Monocytes Relative: 9 %
Neutro Abs: 2.6 10*3/uL (ref 1.7–7.7)
Neutrophils Relative %: 51 %
Platelets: 191 10*3/uL (ref 150–400)
RBC: 4.28 MIL/uL (ref 4.22–5.81)
RDW: 13.1 % (ref 11.5–15.5)
WBC: 5.1 10*3/uL (ref 4.0–10.5)
nRBC: 0 % (ref 0.0–0.2)

## 2021-03-09 LAB — BASIC METABOLIC PANEL
Anion gap: 7 (ref 5–15)
BUN: 21 mg/dL — ABNORMAL HIGH (ref 6–20)
CO2: 25 mmol/L (ref 22–32)
Calcium: 9.3 mg/dL (ref 8.9–10.3)
Chloride: 104 mmol/L (ref 98–111)
Creatinine, Ser: 1.39 mg/dL — ABNORMAL HIGH (ref 0.61–1.24)
GFR, Estimated: >60 mL/min (ref >60)
Glucose, Bld: 85 mg/dL (ref 70–99)
Potassium: 4.6 mmol/L (ref 3.5–5.1)
Sodium: 136 mmol/L (ref 135–145)

## 2021-03-09 LAB — CBG MONITORING, ED: Glucose-Capillary: 175 mg/dL — ABNORMAL HIGH (ref 70–99)

## 2021-03-09 NOTE — ED Notes (Signed)
Pt transported to CT via wheelchair.

## 2021-03-09 NOTE — Discharge Instructions (Signed)
Please follow-up with your primary care doctor to discuss the episode from today.  Come back to ER if you develop chest pain, difficulty breathing, episode of passing out, numbness, weakness, speech or vision change or other new concerning symptom.

## 2021-03-09 NOTE — ED Notes (Signed)
No acute distress noted upon this RN's departure of patient. Verified discharge paperwork with name and DOB. Vital signs stable. Patient taken to checkout window. Discharge paperwork discussed with patient. No further questions voiced upon discharge.

## 2021-03-09 NOTE — ED Provider Notes (Signed)
[facility] EMERGENCY DEPT Provider Note   CSN: [id number] Arrival date & time: 03/09/21  1133     History Chief Complaint  Patient presents with   Visual Field Change    Russell Adams is a 50 y.o. male.  Presents to ER with concern for episode of blurry vision.  Reports that he was bending over, on his knees in the backseat of the car trying to adjust his seatbelt and straining himself.  When he stepped out of the car he felt lightheaded and felt like his vision went blurry and dark.  Thinks this lasted a couple minutes.  No chest pain, no difficulty in breathing.  Did not fully lose consciousness.  No bladder or bowel incontinence.  States that he did not have any associated numbness, weakness, speech changes.    HPI     Past Medical History:  Diagnosis Date   Alcohol dependence ([place])    Allergic rhinitis    Arthritis    Barrett esophagus    BPH (benign prostatic hyperplasia)    Chronic back pain    Colon polyps    Depression    Diverticulitis    Diverticulosis    Fibromyalgia    GERD (gastroesophageal reflux disease)    Hiatal hernia    History of alcohol abuse    History of chicken pox    Hypercholesterolemia    Hyperglyceridemia    Hypertension    IBS (irritable bowel syndrome)    Insomnia    Migraines    Neuropathy    PTSD (post-traumatic stress disorder)    Seizures ([place])    Tobacco use     Patient Active Problem List   Diagnosis Date Noted   Allergic rhinitis 02/20/2017   Major depression, recurrent, chronic ([place]) 02/20/2017   Foot pain 02/20/2017   History of avascular necrosis of capital femoral epiphysis 02/28/2015   Diverticulitis of large intestine with perforation without bleeding    Paresthesia 05/26/2014   Midline low back pain without sciatica 05/26/2014   Spondylosis of lumbar region without myelopathy or radiculopathy 05/12/2014   Barrett's esophagus without dysplasia 01/02/2014   BPH (benign prostatic hyperplasia)  03/04/2013   Cigarette nicotine dependence 01/28/2012   Fibromyalgia 01/28/2012   FATTY LIVER DISEASE 05/09/2009   HYPERTRIGLYCERIDEMIA 05/02/2009   Essential hypertension 04/18/2009   CARPAL TUNNEL SYNDROME 03/06/2009   HYPERCHOLESTEROLEMIA 12/24/2006   POST TRAUMATIC STRESS SYNDROME 12/24/2006   GERD 12/24/2006    Past Surgical History:  Procedure Laterality Date   HERNIA REPAIR Right 20115/2017   inguinal x2       Family History  Problem Relation Age of Onset   Hypertension Mother    Heart attack Father    Diabetes Father    Alcoholism Father    Drug abuse Father    Diabetes Sister    Diabetes Sister     Social History   Tobacco Use   Smoking status: Every Day    Packs/day: 0.50    Years: 11.00    Pack years: 5.50    Types: Cigarettes   Smokeless tobacco: Never  Substance Use Topics   Alcohol use: No    Alcohol/week: 42.0 standard drinks    Types: 42 Cans of beer per week    Comment: Six pack beer/day/ quit 2017   Drug use: No    Types: Marijuana    Home Medications Prior to Admission medications   Medication Sig Start Date End Date Taking? Authorizing Provider  acetaminophen (TYLENOL) 500 MG  tablet Take 1,000 mg by mouth every 6 (six) hours as needed for mild pain.    [provider]  albuterol (VENTOLIN HFA) 108 (90 Base) MCG/ACT inhaler Inhale 1-2 puffs into the lungs every 4 to 6 hours 02/25/21     atorvastatin (LIPITOR) 10 MG tablet TAKE 1 TABLET (10 MG TOTAL) BY MOUTH DAILY. 06/15/20 06/15/21  Joya Gaskins, FNP  Buprenorphine HCl-Naloxone HCl (SUBOXONE) 8-2 MG FILM Place 1 (one) Film under tongue three times daily 09/17/20     Buprenorphine HCl-Naloxone HCl (SUBOXONE) 8-2 MG FILM Dissolve 1 (one) Film under tongue three times daily 10/15/20     Buprenorphine HCl-Naloxone HCl (SUBOXONE) 8-2 MG FILM Place 1 Film under the tongue 3 (three) times daily. 11/14/20     Buprenorphine HCl-Naloxone HCl (SUBOXONE) 8-2 MG FILM Place 1 Film under the  tongue 3 (three) times daily. 12/13/20     Buprenorphine HCl-Naloxone HCl (SUBOXONE) 8-2 MG FILM Place 1 film tab under the tongue 3 (three) times daily. 02/12/21     Buprenorphine HCl-Naloxone HCl 4-1 MG FILM Take 1 Film by mouth 3 (three) times daily. 08/14/15   [provider]  Buprenorphine HCl-Naloxone HCl 8-2 MG FILM Place 1 (one) Film under tongue three times daily 07/19/20     Buprenorphine HCl-Naloxone HCl 8-2 MG FILM Place 1 Film under the tongue 3 (three) times daily. 07/20/20     Buprenorphine HCl-Naloxone HCl 8-2 MG FILM Place 1 (one) Film under tongue three times daily 08/17/20     busPIRone (BUSPAR) 15 MG tablet TAKE 1 TABLET BY MOUTH 3 TIMES A DAY 04/11/20 04/11/21    busPIRone (BUSPAR) 15 MG tablet Take 1 tablet by mouth 3 times a day 07/02/20     busPIRone (BUSPAR) 15 MG tablet Take 1 tablet by mouth 3 times a day 09/26/20     busPIRone (BUSPAR) 15 MG tablet Take 1 tablet by mouth 3 times a day 12/19/20     busPIRone (BUSPAR) 15 MG tablet Take 1 tablet by mouth 3 times a day 03/01/21     cetirizine (ZYRTEC) 10 MG tablet Take 10 mg by mouth daily.     [provider]  clonazePAM (KLONOPIN) 1 MG tablet Take 1 mg by mouth 2 (two) times daily as needed for anxiety.  04/25/14   [provider]  diclofenac Sodium (VOLTAREN) 1 % GEL APPLY 2 (TWO) GRAMS TO AFFECTED AREA AS DIRECTED 04/24/20 04/24/21  Emelia Loron, NP  fenofibrate (TRICOR) 145 MG tablet Take 145 mg by mouth daily.  03/27/14   [provider]  fenofibrate (TRICOR) 145 MG tablet TAKE 1 TABLET BY MOUTH DAILY AT BEDTIME 05/23/20 05/23/21  Joya Gaskins, FNP  fenofibrate (TRICOR) 145 MG tablet TAKE 1 TABLET DAILY AT BEDTIME 11/07/19 11/06/20  Pieter Partridge, PA  fenofibrate (TRICOR) 145 MG tablet Take 1 tablet (145 mg total) by mouth at bedtime. 02/11/21     gabapentin (NEURONTIN) 300 MG capsule Take 300 mg by mouth 4 (four) times daily.    [provider]  gabapentin (NEURONTIN) 300 MG capsule TAKE  2 CAPSULES BY MOUTH 3 TIMES A DAY 04/11/20 04/11/21    gabapentin (NEURONTIN) 300 MG capsule TAKE 2 CAPSULES BY MOUTH 3 TIMES A DAY 02/01/20 01/31/21    gabapentin (NEURONTIN) 300 MG capsule TAKE 2 CAPSULES BY MOUTH 3 TIMES A DAY 11/09/19 11/08/20    gabapentin (NEURONTIN) 300 MG capsule Take 2 capsules (600 mg total) by mouth 3 (three) times daily. 07/02/20  gabapentin (NEURONTIN) 300 MG capsule Take 2 capsules (600 mg total) by mouth 3 (three) times daily. 09/26/20     gabapentin (NEURONTIN) 300 MG capsule Take 2 capsules (600 mg total) by mouth 3 (three) times daily. 12/19/20     gabapentin (NEURONTIN) 300 MG capsule Take 2 capsules (600 mg total) by mouth 3 (three) times daily. 03/01/21     hydrochlorothiazide (HYDRODIURIL) 25 MG tablet Take 1 tablet by mouth daily. 11/17/16   [provider]  Lidocaine (ZTLIDO) 1.8 % PTCH Aplly 1 patch to affected area daily 01/10/21     losartan (COZAAR) 50 MG tablet TAKE 1 TABLET (50 MG TOTAL) BY MOUTH DAILY. 03/05/20 03/05/21  Joya Gaskins, FNP  losartan (COZAAR) 50 MG tablet TAKE 1 TABLET (50 MG TOTAL) BY MOUTH DAILY. 01/03/20 01/02/21  Joya Gaskins, FNP  losartan (COZAAR) 50 MG tablet Take 1 tablet (50 mg total) by mouth daily. 12/03/20     naloxone (NARCAN) nasal spray 4 mg/0.1 mL Place 1 spray into the nose as directed. 12/13/20     omega-3 acid ethyl esters (LOVAZA) 1 g capsule TAKE ONE CAPSULE BY MOUTH EACH DAY 05/23/20 05/23/21  Joya Gaskins, FNP  omega-3 acid ethyl esters (LOVAZA) 1 g capsule TAKE ONE CAPSULE BY MOUTH EACH DAY 11/07/19 11/06/20  Pieter Partridge, PA  omega-3 acid ethyl esters (LOVAZA) 1 g capsule Take 1 capsule (1 g total) by mouth daily. 12/17/20     Omega-3 Fatty Acids (FISH OIL) 1000 MG CAPS Take by mouth.    [provider]  omeprazole (PRILOSEC) 20 MG capsule Take 20 mg by mouth 2 (two) times daily before a meal.  09/13/13   [provider]  omeprazole (PRILOSEC) 20 MG capsule Take 1 capsule  (20 mg total) by mouth 2 (two) times daily. 02/07/21     PROAIR HFA 108 (90 Base) MCG/ACT inhaler INHALE 1 TO 2 PUFFS EVERY 4-6 HOURS AS NEEDED 10/03/19 10/02/20  Joya Gaskins, FNP  PROAIR HFA 108 [phone] Base) MCG/ACT inhaler INHALE 1 TO 2 PUFFS EVERY 4-6 HOURS AS NEEDED 02/25/21     sertraline (ZOLOFT) 100 MG tablet Take 100 mg by mouth daily.    [provider]  sertraline (ZOLOFT) 100 MG tablet TAKE 2 TABLETS BY MOUTH IN THE MORNING 04/11/20 04/11/21    sertraline (ZOLOFT) 100 MG tablet TAKE 2 TABLETS BY MOUTH IN THE MORNING 02/01/20 01/31/21    sertraline (ZOLOFT) 100 MG tablet Take 2 tablets (200 mg total) by mouth in the morning. 03/01/21     tamsulosin (FLOMAX) 0.4 MG CAPS capsule  12/03/17   [provider]  tamsulosin (FLOMAX) 0.4 MG CAPS capsule TAKE 1 CAPSULE BY MOUTH TWICE DAILY 04/02/20 04/02/21  Jacalyn Lefevre D, MD  tamsulosin (FLOMAX) 0.4 MG CAPS capsule TAKE 1 CAPSULE BY MOUTH 2 TIMES DAILY 08/03/20 08/03/21  Robley Fries, MD    Allergies    Vitamin b12 and Metaxalone  Review of Systems   Review of Systems  Constitutional:  Negative for chills and fever.  HENT:  Negative for ear pain and sore throat.   Eyes:  Positive for visual disturbance. Negative for pain.  Respiratory:  Negative for cough and shortness of breath.   Cardiovascular:  Negative for chest pain and palpitations.  Gastrointestinal:  Negative for abdominal pain and vomiting.  Genitourinary:  Negative for dysuria and hematuria.  Musculoskeletal:  Negative for arthralgias and back pain.  Skin:  Negative for color change and rash.  Neurological:  Positive for light-headedness. Negative for seizures and syncope.  All other systems reviewed and are negative.  Physical Exam Updated Vital Signs BP 122/89    Pulse (!) 46    Temp 97.9 F (36.6 C)    Resp 18    Ht 6' (1.829 m)    Wt 95.3 kg    SpO2 96%    BMI 28.48 kg/m   Physical Exam Vitals and nursing note reviewed.  Constitutional:       General: He is not in acute distress.    Appearance: He is well-developed.  HENT:     Head: Normocephalic and atraumatic.  Eyes:     Conjunctiva/sclera: Conjunctivae normal.  Cardiovascular:     Rate and Rhythm: Normal rate and regular rhythm.     Heart sounds: No murmur heard. Pulmonary:     Effort: Pulmonary effort is normal. No respiratory distress.     Breath sounds: Normal breath sounds.  Abdominal:     Palpations: Abdomen is soft.     Tenderness: There is no abdominal tenderness.  Musculoskeletal:        General: No swelling.     Cervical back: Neck supple.  Skin:    General: Skin is warm and dry.     Capillary Refill: Capillary refill takes less than 2 seconds.  Neurological:     General: No focal deficit present.     Mental Status: He is alert and oriented to person, place, and time.     Comments: AAOx3 CN 2-12 intact, speech clear visual fields intact 5/5 strength in b/l UE and LE Sensation to light touch intact in b/l UE and LE Normal FNF Normal gait  Psychiatric:        Mood and Affect: Mood normal.    ED Results / Procedures / Treatments   Labs (all labs ordered are listed, but only abnormal results are displayed) Labs Reviewed  CBC WITH DIFFERENTIAL/PLATELET - Abnormal; Notable for the following components:      Result Value   Hemoglobin 12.3 (*)    HCT 38.3 (*)    All other components within normal limits  BASIC METABOLIC PANEL - Abnormal; Notable for the following components:   BUN 21 (*)    Creatinine, Ser 1.39 (*)    All other components within normal limits  CBG MONITORING, ED - Abnormal; Notable for the following components:   Glucose-Capillary 175 (*)    All other components within normal limits    EKG EKG Interpretation  Date/Time:  Saturday March 09 2021 12:23:58 EST Ventricular Rate:  65 PR Interval:  158 QRS Duration: 98 QT Interval:  389 QTC Calculation: 405 R Axis:   52 Text Interpretation: Sinus rhythm Abnormal R-wave  progression, early transition Confirmed by Madalyn Rob [phone]) on 03/09/2021 2:08:57 PM  Radiology CT Head Wo Contrast  Result Date: 03/09/2021 CLINICAL DATA:  Blurred vision EXAM: CT HEAD WITHOUT CONTRAST TECHNIQUE: Contiguous axial images were obtained from the base of the skull through the vertex without intravenous contrast. COMPARISON:  None. FINDINGS: Brain: No acute intracranial hemorrhage, mass effect, or herniation. No extra-axial fluid collections. No evidence of acute territorial infarct. No hydrocephalus. Vascular: No hyperdense vessel or unexpected calcification. Skull: Normal. Negative for fracture or focal lesion. Sinuses/Orbits: No acute finding. Other: None. IMPRESSION: No acute intracranial process identified. Electronically Signed   By: Ofilia Neas M.D.   On: 03/09/2021 12:54    Procedures Procedures   Medications Ordered in ED Medications - No data  to display  ED Course  I have reviewed the triage vital signs and the nursing notes.  Pertinent labs & imaging results that were available during my care of the patient were reviewed by me and considered in my medical decision making (see chart for details).    MDM Rules/Calculators/A&P                         50 year old gentleman presents to ER after episode of having a couple minutes of blurred vision, lightheadedness.  Currently has no ongoing symptoms, normal neurologic exam.  Suspect most likely vasovagal, near syncopal in nature.  EKG without acute change, no events on telemetry monitoring, basic labs stable, did check CT head which was negative for acute intracranial process.  Recommend follow-up with primary care.  Given work-up today, well appearance and no ongoing symptoms, believe he can be discharged and managed in the outpatient setting.  After the discussed management above, the patient was determined to be safe for discharge.  The patient was in agreement with this plan and all questions regarding  their care were answered.  ED return precautions were discussed and the patient will return to the ED with any significant worsening of condition.     Final Clinical Impression(s) / ED Diagnoses Final diagnoses:  Transient visual disturbance    Rx / DC Orders ED Discharge Orders     None        Lucrezia Starch, MD 03/11/21 [phone]

## 2021-03-09 NOTE — ED Triage Notes (Signed)
Reports was reaching for seat belt in car , on knees leaning over , blurry vision lasted a few minutes , adds numbness to left  face , vision resolved . Alert and oriented x 4 . Hx HTN

## 2021-03-10 MED FILL — Atorvastatin Calcium Tab 10 MG (Base Equivalent): ORAL | 90 days supply | Qty: 90 | Fill #1 | Status: AC

## 2021-03-11 ENCOUNTER — Other Ambulatory Visit (HOSPITAL_COMMUNITY): Payer: Self-pay

## 2021-03-12 ENCOUNTER — Other Ambulatory Visit (HOSPITAL_COMMUNITY): Payer: Self-pay

## 2021-03-12 MED ORDER — BUPRENORPHINE HCL-NALOXONE HCL 8-2 MG SL FILM
1.0000 | ORAL_FILM | Freq: Three times a day (TID) | SUBLINGUAL | 0 refills | Status: DC
  Filled 2021-03-12: qty 90, 30d supply, fill #0

## 2021-03-25 ENCOUNTER — Other Ambulatory Visit (HOSPITAL_COMMUNITY): Payer: Self-pay

## 2021-03-26 ENCOUNTER — Other Ambulatory Visit (HOSPITAL_COMMUNITY): Payer: Self-pay

## 2021-04-03 ENCOUNTER — Other Ambulatory Visit (HOSPITAL_COMMUNITY): Payer: Self-pay

## 2021-04-11 ENCOUNTER — Other Ambulatory Visit (HOSPITAL_COMMUNITY): Payer: Self-pay

## 2021-04-11 MED ORDER — BUPRENORPHINE HCL-NALOXONE HCL 8-2 MG SL FILM
1.0000 | ORAL_FILM | Freq: Three times a day (TID) | SUBLINGUAL | 0 refills | Status: DC
  Filled 2021-04-11: qty 90, 30d supply, fill #0

## 2021-04-23 ENCOUNTER — Other Ambulatory Visit (HOSPITAL_COMMUNITY): Payer: Self-pay

## 2021-05-09 ENCOUNTER — Other Ambulatory Visit (HOSPITAL_COMMUNITY): Payer: Self-pay

## 2021-05-09 MED ORDER — OMEPRAZOLE 20 MG PO CPDR
20.0000 mg | DELAYED_RELEASE_CAPSULE | Freq: Two times a day (BID) | ORAL | 0 refills | Status: DC
  Filled 2021-05-09: qty 180, 90d supply, fill #0

## 2021-05-10 ENCOUNTER — Other Ambulatory Visit (HOSPITAL_COMMUNITY): Payer: Self-pay

## 2021-05-10 MED ORDER — BUPRENORPHINE HCL-NALOXONE HCL 8-2 MG SL FILM
1.0000 | ORAL_FILM | Freq: Three times a day (TID) | SUBLINGUAL | 0 refills | Status: DC
  Filled 2021-05-10: qty 90, 30d supply, fill #0

## 2021-05-24 ENCOUNTER — Other Ambulatory Visit (HOSPITAL_COMMUNITY): Payer: Self-pay

## 2021-05-27 ENCOUNTER — Other Ambulatory Visit (HOSPITAL_COMMUNITY): Payer: Self-pay

## 2021-06-06 ENCOUNTER — Other Ambulatory Visit (HOSPITAL_COMMUNITY): Payer: Self-pay

## 2021-06-06 MED ORDER — ATORVASTATIN CALCIUM 10 MG PO TABS
10.0000 mg | ORAL_TABLET | Freq: Every day | ORAL | 0 refills | Status: DC
  Filled 2021-06-06: qty 90, 90d supply, fill #0

## 2021-06-06 MED ORDER — LOSARTAN POTASSIUM 50 MG PO TABS
50.0000 mg | ORAL_TABLET | Freq: Every day | ORAL | 0 refills | Status: DC
  Filled 2021-06-06: qty 90, 90d supply, fill #0

## 2021-06-06 MED ORDER — OMEGA-3-ACID ETHYL ESTERS 1 G PO CAPS
1.0000 | ORAL_CAPSULE | Freq: Every day | ORAL | 0 refills | Status: DC
  Filled 2021-06-06: qty 90, 90d supply, fill #0

## 2021-06-07 ENCOUNTER — Other Ambulatory Visit (HOSPITAL_COMMUNITY): Payer: Self-pay

## 2021-06-07 MED ORDER — BUPRENORPHINE HCL-NALOXONE HCL 8-2 MG SL FILM
1.0000 | ORAL_FILM | Freq: Three times a day (TID) | SUBLINGUAL | 0 refills | Status: DC
  Filled 2021-06-07: qty 90, 30d supply, fill #0

## 2021-06-18 ENCOUNTER — Other Ambulatory Visit (HOSPITAL_COMMUNITY): Payer: Self-pay

## 2021-06-18 MED ORDER — SERTRALINE HCL 100 MG PO TABS
200.0000 mg | ORAL_TABLET | Freq: Every morning | ORAL | 0 refills | Status: DC
  Filled 2021-06-18 – 2021-08-05 (×2): qty 180, 90d supply, fill #0

## 2021-06-18 MED ORDER — BUSPIRONE HCL 15 MG PO TABS
15.0000 mg | ORAL_TABLET | Freq: Three times a day (TID) | ORAL | 3 refills | Status: AC
  Filled 2021-06-18: qty 90, 30d supply, fill #0
  Filled 2021-09-09: qty 90, 30d supply, fill #1
  Filled 2022-06-06: qty 90, 30d supply, fill #2

## 2021-06-18 MED ORDER — GABAPENTIN 300 MG PO CAPS
600.0000 mg | ORAL_CAPSULE | Freq: Three times a day (TID) | ORAL | 3 refills | Status: AC
Start: 2021-06-18 — End: ?
  Filled 2021-06-18 – 2021-08-05 (×2): qty 180, 30d supply, fill #0
  Filled 2021-09-12: qty 180, 30d supply, fill #1
  Filled 2021-10-07 – 2021-10-17 (×2): qty 180, 30d supply, fill #2
  Filled 2021-11-11 – 2021-11-17 (×3): qty 180, 30d supply, fill #3

## 2021-06-21 ENCOUNTER — Other Ambulatory Visit (HOSPITAL_COMMUNITY): Payer: Self-pay

## 2021-06-24 ENCOUNTER — Other Ambulatory Visit (HOSPITAL_COMMUNITY): Payer: Self-pay

## 2021-07-08 ENCOUNTER — Other Ambulatory Visit (HOSPITAL_COMMUNITY): Payer: Self-pay

## 2021-07-09 ENCOUNTER — Other Ambulatory Visit (HOSPITAL_COMMUNITY): Payer: Self-pay

## 2021-07-09 MED ORDER — BUPRENORPHINE HCL-NALOXONE HCL 8-2 MG SL FILM
1.0000 | ORAL_FILM | Freq: Three times a day (TID) | SUBLINGUAL | 0 refills | Status: DC
  Filled 2021-07-09: qty 84, 28d supply, fill #0
  Filled 2021-07-09: qty 6, 2d supply, fill #0

## 2021-07-10 ENCOUNTER — Other Ambulatory Visit (HOSPITAL_COMMUNITY): Payer: Self-pay

## 2021-07-22 ENCOUNTER — Other Ambulatory Visit (HOSPITAL_COMMUNITY): Payer: Self-pay

## 2021-07-22 MED ORDER — VALACYCLOVIR HCL 1 G PO TABS
2000.0000 mg | ORAL_TABLET | ORAL | 1 refills | Status: AC
Start: 2021-07-22 — End: ?
  Filled 2021-07-22 – 2021-10-30 (×2): qty 4, 1d supply, fill #0

## 2021-08-05 ENCOUNTER — Other Ambulatory Visit (HOSPITAL_COMMUNITY): Payer: Self-pay

## 2021-08-05 MED ORDER — OMEPRAZOLE 20 MG PO CPDR
20.0000 mg | DELAYED_RELEASE_CAPSULE | Freq: Two times a day (BID) | ORAL | 0 refills | Status: DC
  Filled 2021-08-05: qty 180, 90d supply, fill #0

## 2021-08-05 MED ORDER — FENOFIBRATE 145 MG PO TABS
145.0000 mg | ORAL_TABLET | Freq: Every day | ORAL | 1 refills | Status: DC
Start: 2021-08-05 — End: 2022-02-12
  Filled 2021-08-05: qty 90, 90d supply, fill #0
  Filled 2021-11-11: qty 90, 90d supply, fill #1

## 2021-08-06 ENCOUNTER — Other Ambulatory Visit (HOSPITAL_COMMUNITY): Payer: Self-pay

## 2021-08-06 MED ORDER — BUPRENORPHINE HCL-NALOXONE HCL 8-2 MG SL FILM
1.0000 | ORAL_FILM | Freq: Three times a day (TID) | SUBLINGUAL | 0 refills | Status: DC
  Filled 2021-08-06: qty 90, 30d supply, fill #0

## 2021-08-06 MED ORDER — TRIAMCINOLONE ACETONIDE 0.1 % EX CREA
1.0000 "application " | TOPICAL_CREAM | Freq: Two times a day (BID) | CUTANEOUS | 0 refills | Status: AC
  Filled 2021-08-06: qty 45, 23d supply, fill #0

## 2021-08-16 ENCOUNTER — Other Ambulatory Visit (HOSPITAL_COMMUNITY): Payer: Self-pay

## 2021-08-16 MED ORDER — ALBUTEROL SULFATE HFA 108 (90 BASE) MCG/ACT IN AERS
1.0000 | INHALATION_SPRAY | RESPIRATORY_TRACT | 1 refills | Status: AC
  Filled 2021-08-16: qty 18, 25d supply, fill #0
  Filled 2021-10-30: qty 18, 25d supply, fill #1

## 2021-08-26 ENCOUNTER — Other Ambulatory Visit (HOSPITAL_COMMUNITY): Payer: Self-pay

## 2021-08-27 ENCOUNTER — Other Ambulatory Visit (HOSPITAL_COMMUNITY): Payer: Self-pay

## 2021-09-05 ENCOUNTER — Other Ambulatory Visit (HOSPITAL_COMMUNITY): Payer: Self-pay

## 2021-09-05 MED ORDER — BUPRENORPHINE HCL-NALOXONE HCL 8-2 MG SL FILM
1.0000 | ORAL_FILM | Freq: Three times a day (TID) | SUBLINGUAL | 0 refills | Status: DC
Start: 2021-09-05 — End: 2021-10-04
  Filled 2021-09-05: qty 90, 30d supply, fill #0

## 2021-09-09 ENCOUNTER — Other Ambulatory Visit (HOSPITAL_COMMUNITY): Payer: Self-pay

## 2021-09-09 MED ORDER — ATORVASTATIN CALCIUM 10 MG PO TABS
10.0000 mg | ORAL_TABLET | Freq: Every day | ORAL | 0 refills | Status: DC
Start: 2021-09-09 — End: 2021-10-30
  Filled 2021-09-09: qty 90, 90d supply, fill #0

## 2021-09-10 ENCOUNTER — Other Ambulatory Visit (HOSPITAL_COMMUNITY): Payer: Self-pay

## 2021-09-12 ENCOUNTER — Other Ambulatory Visit (HOSPITAL_COMMUNITY): Payer: Self-pay

## 2021-09-12 MED ORDER — OMEGA-3-ACID ETHYL ESTERS 1 G PO CAPS
1.0000 g | ORAL_CAPSULE | Freq: Every day | ORAL | 0 refills | Status: DC
  Filled 2021-09-12: qty 90, 90d supply, fill #0

## 2021-09-27 ENCOUNTER — Other Ambulatory Visit (HOSPITAL_COMMUNITY): Payer: Self-pay

## 2021-09-27 MED ORDER — GABAPENTIN 300 MG PO CAPS
600.0000 mg | ORAL_CAPSULE | Freq: Three times a day (TID) | ORAL | 3 refills | Status: AC
  Filled 2021-09-27 – 2021-12-27 (×2): qty 180, 30d supply, fill #0
  Filled 2022-01-27: qty 180, 30d supply, fill #1
  Filled 2022-02-27: qty 180, 30d supply, fill #2
  Filled 2022-03-28: qty 180, 30d supply, fill #3

## 2021-09-27 MED ORDER — BUSPIRONE HCL 15 MG PO TABS
15.0000 mg | ORAL_TABLET | Freq: Three times a day (TID) | ORAL | 3 refills | Status: AC
  Filled 2021-09-27: qty 90, 30d supply, fill #0

## 2021-09-27 MED ORDER — SERTRALINE HCL 100 MG PO TABS
200.0000 mg | ORAL_TABLET | Freq: Every morning | ORAL | 0 refills | Status: DC
  Filled 2021-09-27 – 2021-10-17 (×2): qty 180, 90d supply, fill #0

## 2021-10-04 ENCOUNTER — Other Ambulatory Visit (HOSPITAL_COMMUNITY): Payer: Self-pay

## 2021-10-04 MED ORDER — BUPRENORPHINE HCL-NALOXONE HCL 8-2 MG SL FILM
1.0000 | ORAL_FILM | Freq: Three times a day (TID) | SUBLINGUAL | 0 refills | Status: DC
  Filled 2021-10-04 (×2): qty 90, 30d supply, fill #0

## 2021-10-07 ENCOUNTER — Other Ambulatory Visit (HOSPITAL_COMMUNITY): Payer: Self-pay

## 2021-10-17 ENCOUNTER — Other Ambulatory Visit (HOSPITAL_COMMUNITY): Payer: Self-pay

## 2021-10-30 ENCOUNTER — Other Ambulatory Visit (HOSPITAL_COMMUNITY): Payer: Self-pay

## 2021-10-30 MED ORDER — LOSARTAN POTASSIUM 50 MG PO TABS
50.0000 mg | ORAL_TABLET | Freq: Every day | ORAL | 0 refills | Status: AC
Start: 2021-10-30 — End: ?
  Filled 2021-10-30: qty 90, 90d supply, fill #0

## 2021-10-30 MED ORDER — ATORVASTATIN CALCIUM 10 MG PO TABS
10.0000 mg | ORAL_TABLET | Freq: Every day | ORAL | 0 refills | Status: DC
  Filled 2021-10-30 – 2021-12-10 (×2): qty 90, 90d supply, fill #0

## 2021-10-30 MED ORDER — OMEPRAZOLE 20 MG PO CPDR
20.0000 mg | DELAYED_RELEASE_CAPSULE | Freq: Two times a day (BID) | ORAL | 0 refills | Status: DC
  Filled 2021-10-30: qty 180, 90d supply, fill #0

## 2021-11-01 ENCOUNTER — Other Ambulatory Visit (HOSPITAL_COMMUNITY): Payer: Self-pay

## 2021-11-01 MED ORDER — BUPRENORPHINE HCL-NALOXONE HCL 8-2 MG SL FILM
1.0000 | ORAL_FILM | Freq: Three times a day (TID) | SUBLINGUAL | 0 refills | Status: DC
  Filled 2021-11-01: qty 90, 30d supply, fill #0

## 2021-11-11 ENCOUNTER — Other Ambulatory Visit (HOSPITAL_COMMUNITY): Payer: Self-pay

## 2021-11-12 ENCOUNTER — Other Ambulatory Visit (HOSPITAL_COMMUNITY): Payer: Self-pay

## 2021-11-12 MED ORDER — LOSARTAN POTASSIUM 25 MG PO TABS
25.0000 mg | ORAL_TABLET | Freq: Every day | ORAL | 4 refills | Status: AC
  Filled 2021-11-12: qty 30, 30d supply, fill #0

## 2021-11-13 ENCOUNTER — Other Ambulatory Visit (HOSPITAL_COMMUNITY): Payer: Self-pay

## 2021-11-13 MED ORDER — FARXIGA 10 MG PO TABS
10.0000 mg | ORAL_TABLET | Freq: Every day | ORAL | 5 refills | Status: DC
  Filled 2021-11-13: qty 30, 30d supply, fill #0
  Filled 2021-12-19: qty 30, 30d supply, fill #1
  Filled 2022-01-27: qty 30, 30d supply, fill #2
  Filled 2022-02-27: qty 30, 30d supply, fill #3
  Filled 2022-03-28: qty 30, 30d supply, fill #4
  Filled 2022-04-27: qty 30, 30d supply, fill #5

## 2021-11-14 ENCOUNTER — Other Ambulatory Visit (HOSPITAL_COMMUNITY): Payer: Self-pay

## 2021-11-18 ENCOUNTER — Other Ambulatory Visit (HOSPITAL_COMMUNITY): Payer: Self-pay

## 2021-12-03 ENCOUNTER — Other Ambulatory Visit (HOSPITAL_COMMUNITY): Payer: Self-pay

## 2021-12-03 MED ORDER — BUPRENORPHINE HCL-NALOXONE HCL 8-2 MG SL FILM
1.0000 | ORAL_FILM | Freq: Three times a day (TID) | SUBLINGUAL | 0 refills | Status: DC
  Filled 2021-12-03: qty 90, 30d supply, fill #0

## 2021-12-10 ENCOUNTER — Other Ambulatory Visit (HOSPITAL_COMMUNITY): Payer: Self-pay

## 2021-12-10 MED ORDER — OMEGA-3-ACID ETHYL ESTERS 1 G PO CAPS
1.0000 | ORAL_CAPSULE | Freq: Every day | ORAL | 0 refills | Status: DC
  Filled 2021-12-10: qty 90, 90d supply, fill #0

## 2021-12-19 ENCOUNTER — Other Ambulatory Visit (HOSPITAL_COMMUNITY): Payer: Self-pay

## 2021-12-23 ENCOUNTER — Other Ambulatory Visit (HOSPITAL_COMMUNITY): Payer: Self-pay

## 2021-12-26 ENCOUNTER — Other Ambulatory Visit (HOSPITAL_COMMUNITY): Payer: Self-pay

## 2021-12-27 ENCOUNTER — Other Ambulatory Visit (HOSPITAL_COMMUNITY): Payer: Self-pay

## 2022-01-01 ENCOUNTER — Other Ambulatory Visit (HOSPITAL_BASED_OUTPATIENT_CLINIC_OR_DEPARTMENT_OTHER): Payer: Self-pay

## 2022-01-02 ENCOUNTER — Other Ambulatory Visit (HOSPITAL_COMMUNITY): Payer: Self-pay

## 2022-01-02 MED ORDER — BUPRENORPHINE HCL-NALOXONE HCL 8-2 MG SL FILM
1.0000 | ORAL_FILM | Freq: Three times a day (TID) | SUBLINGUAL | 0 refills | Status: DC
  Filled 2022-01-02: qty 90, 30d supply, fill #0

## 2022-01-07 ENCOUNTER — Other Ambulatory Visit (HOSPITAL_COMMUNITY): Payer: Self-pay

## 2022-01-08 ENCOUNTER — Other Ambulatory Visit (HOSPITAL_COMMUNITY): Payer: Self-pay

## 2022-01-08 MED ORDER — SERTRALINE HCL 100 MG PO TABS
200.0000 mg | ORAL_TABLET | Freq: Every morning | ORAL | 0 refills | Status: DC
  Filled 2022-01-08: qty 180, 90d supply, fill #0

## 2022-01-08 MED ORDER — GABAPENTIN 300 MG PO CAPS
600.0000 mg | ORAL_CAPSULE | Freq: Three times a day (TID) | ORAL | 2 refills | Status: AC
  Filled 2022-01-08: qty 180, 30d supply, fill #0

## 2022-01-08 MED ORDER — BUSPIRONE HCL 15 MG PO TABS
15.0000 mg | ORAL_TABLET | Freq: Three times a day (TID) | ORAL | 0 refills | Status: AC
  Filled 2022-01-08: qty 90, 30d supply, fill #0

## 2022-01-13 ENCOUNTER — Other Ambulatory Visit (HOSPITAL_COMMUNITY): Payer: Self-pay

## 2022-01-13 MED ORDER — PROMETHAZINE-DM 6.25-15 MG/5ML PO SYRP
5.0000 mL | ORAL_SOLUTION | Freq: Four times a day (QID) | ORAL | 0 refills | Status: AC
  Filled 2022-01-13: qty 180, 9d supply, fill #0

## 2022-01-13 MED ORDER — PREDNISONE 20 MG PO TABS
20.0000 mg | ORAL_TABLET | Freq: Two times a day (BID) | ORAL | 0 refills | Status: AC
  Filled 2022-01-13: qty 10, 5d supply, fill #0

## 2022-01-13 MED ORDER — DOXYCYCLINE MONOHYDRATE 100 MG PO CAPS
100.0000 mg | ORAL_CAPSULE | Freq: Two times a day (BID) | ORAL | 0 refills | Status: AC
  Filled 2022-01-13: qty 20, 10d supply, fill #0

## 2022-01-19 ENCOUNTER — Encounter (HOSPITAL_BASED_OUTPATIENT_CLINIC_OR_DEPARTMENT_OTHER): Payer: Self-pay | Admitting: *Deleted

## 2022-01-19 ENCOUNTER — Emergency Department (HOSPITAL_BASED_OUTPATIENT_CLINIC_OR_DEPARTMENT_OTHER)
Admission: EM | Admit: 2022-01-19 | Discharge: 2022-01-19 | Discharge status: Against Medical Advice | Payer: Medicaid Other | Attending: Emergency Medicine | Admitting: Emergency Medicine

## 2022-01-19 ENCOUNTER — Other Ambulatory Visit: Payer: Self-pay

## 2022-01-19 DIAGNOSIS — I959 Hypotension, unspecified: Secondary | ICD-10-CM | POA: Insufficient documentation

## 2022-01-19 DIAGNOSIS — Z5321 Procedure and treatment not carried out due to patient leaving prior to being seen by health care provider: Secondary | ICD-10-CM | POA: Insufficient documentation

## 2022-01-19 DIAGNOSIS — J00 Acute nasopharyngitis [common cold]: Secondary | ICD-10-CM | POA: Diagnosis present

## 2022-01-19 DIAGNOSIS — R42 Dizziness and giddiness: Secondary | ICD-10-CM | POA: Insufficient documentation

## 2022-01-19 HISTORY — DX: Chronic kidney disease, unspecified: N18.9

## 2022-01-19 LAB — CBC
HCT: 38.6 % — ABNORMAL LOW (ref 39.0–52.0)
Hemoglobin: 12.6 g/dL — ABNORMAL LOW (ref 13.0–17.0)
MCH: 27.7 pg (ref 26.0–34.0)
MCHC: 32.6 g/dL (ref 30.0–36.0)
MCV: 84.8 fL (ref 80.0–100.0)
Platelets: 301 10*3/uL (ref 150–400)
RBC: 4.55 MIL/uL (ref 4.22–5.81)
RDW: 13.1 % (ref 11.5–15.5)
WBC: 8.2 10*3/uL (ref 4.0–10.5)
nRBC: 0 % (ref 0.0–0.2)

## 2022-01-19 LAB — BASIC METABOLIC PANEL
Anion gap: 9 (ref 5–15)
BUN: 29 mg/dL — ABNORMAL HIGH (ref 6–20)
CO2: 23 mmol/L (ref 22–32)
Calcium: 9.4 mg/dL (ref 8.9–10.3)
Chloride: 98 mmol/L (ref 98–111)
Creatinine, Ser: 1.33 mg/dL — ABNORMAL HIGH (ref 0.61–1.24)
GFR, Estimated: >60 mL/min (ref >60)
Glucose, Bld: 88 mg/dL (ref 70–99)
Potassium: 3.7 mmol/L (ref 3.5–5.1)
Sodium: 130 mmol/L — ABNORMAL LOW (ref 135–145)

## 2022-01-19 NOTE — ED Notes (Signed)
Patient states he cannot wait any longer.  He is aware that he is leaving against medical advice.  He has not been seen by a medical provider.  Patient family is waiting on him in the car

## 2022-01-19 NOTE — ED Triage Notes (Signed)
Patient states he noticed his hands and feet felt cold.  He states the past couple of days he had dizziness with position changes.  Patient was taking doxy and prednisone for URI.  Patient has been on this RX for the past 4 days.  Patient is drinking and eating.  Patient is on [place] for the past month and hypotension is one of the side effects. He is also on [place].  Patient states he just feels weird.   He is alert in triage.  Patient skin is warm and dry.  Patient denies any pain.

## 2022-01-22 ENCOUNTER — Other Ambulatory Visit (HOSPITAL_COMMUNITY): Payer: Self-pay

## 2022-01-27 ENCOUNTER — Other Ambulatory Visit (HOSPITAL_COMMUNITY): Payer: Self-pay

## 2022-01-27 MED ORDER — OMEPRAZOLE 20 MG PO CPDR
20.0000 mg | DELAYED_RELEASE_CAPSULE | Freq: Two times a day (BID) | ORAL | 0 refills | Status: DC
  Filled 2022-01-27: qty 180, 90d supply, fill #0

## 2022-01-30 ENCOUNTER — Other Ambulatory Visit (HOSPITAL_COMMUNITY): Payer: Self-pay

## 2022-01-30 MED ORDER — BUPRENORPHINE HCL-NALOXONE HCL 8-2 MG SL FILM
1.0000 | ORAL_FILM | Freq: Three times a day (TID) | SUBLINGUAL | 0 refills | Status: DC
  Filled 2022-01-30: qty 90, 30d supply, fill #0

## 2022-02-12 ENCOUNTER — Other Ambulatory Visit (HOSPITAL_COMMUNITY): Payer: Self-pay

## 2022-02-12 MED ORDER — FENOFIBRATE 145 MG PO TABS
145.0000 mg | ORAL_TABLET | Freq: Every day | ORAL | 1 refills | Status: DC
  Filled 2022-02-12: qty 90, 90d supply, fill #0
  Filled 2022-05-10: qty 90, 90d supply, fill #1

## 2022-02-28 ENCOUNTER — Other Ambulatory Visit (HOSPITAL_COMMUNITY): Payer: Self-pay

## 2022-02-28 MED ORDER — BUPRENORPHINE HCL-NALOXONE HCL 8-2 MG SL FILM
1.0000 | ORAL_FILM | Freq: Three times a day (TID) | SUBLINGUAL | 0 refills | Status: DC
  Filled 2022-02-28: qty 90, 30d supply, fill #0

## 2022-03-03 ENCOUNTER — Other Ambulatory Visit (HOSPITAL_COMMUNITY): Payer: Self-pay

## 2022-03-03 MED ORDER — ALBUTEROL SULFATE HFA 108 (90 BASE) MCG/ACT IN AERS
1.0000 | INHALATION_SPRAY | RESPIRATORY_TRACT | 5 refills | Status: DC | PRN
  Filled 2022-03-03: qty 18, 34d supply, fill #0
  Filled 2022-06-16: qty 18, 34d supply, fill #1
  Filled 2022-09-30: qty 18, 34d supply, fill #2
  Filled 2023-01-20: qty 18, 34d supply, fill #3

## 2022-03-04 ENCOUNTER — Other Ambulatory Visit (HOSPITAL_COMMUNITY): Payer: Self-pay

## 2022-03-04 MED ORDER — ATORVASTATIN CALCIUM 10 MG PO TABS
10.0000 mg | ORAL_TABLET | Freq: Every day | ORAL | 1 refills | Status: DC
  Filled 2022-03-04: qty 90, 90d supply, fill #0
  Filled 2022-06-04: qty 90, 90d supply, fill #1

## 2022-03-04 MED ORDER — OMEGA-3-ACID ETHYL ESTERS 1 G PO CAPS
1.0000 g | ORAL_CAPSULE | Freq: Every day | ORAL | 1 refills | Status: DC
  Filled 2022-03-04: qty 90, 90d supply, fill #0
  Filled 2022-06-06: qty 90, 90d supply, fill #1

## 2022-03-05 ENCOUNTER — Other Ambulatory Visit (HOSPITAL_COMMUNITY): Payer: Self-pay

## 2022-03-05 MED ORDER — FLUTICASONE PROPIONATE 50 MCG/ACT NA SUSP
1.0000 | Freq: Two times a day (BID) | NASAL | 3 refills | Status: AC
  Filled 2022-03-05: qty 16, 30d supply, fill #0
  Filled 2022-06-06: qty 16, 30d supply, fill #1
  Filled 2022-07-16: qty 16, 30d supply, fill #2

## 2022-03-31 ENCOUNTER — Other Ambulatory Visit (HOSPITAL_COMMUNITY): Payer: Self-pay

## 2022-03-31 MED ORDER — VALACYCLOVIR HCL 500 MG PO TABS
500.0000 mg | ORAL_TABLET | Freq: Two times a day (BID) | ORAL | 5 refills | Status: AC
  Filled 2022-03-31: qty 6, 3d supply, fill #0
  Filled 2022-04-11: qty 6, 3d supply, fill #1
  Filled 2022-04-27: qty 6, 3d supply, fill #2
  Filled 2022-06-06: qty 6, 3d supply, fill #3
  Filled 2022-06-25: qty 6, 3d supply, fill #4
  Filled 2022-06-30: qty 6, 3d supply, fill #5

## 2022-03-31 MED ORDER — BUPRENORPHINE HCL-NALOXONE HCL 8-2 MG SL FILM
1.0000 | ORAL_FILM | Freq: Three times a day (TID) | SUBLINGUAL | 0 refills | Status: DC
  Filled 2022-03-31: qty 90, 30d supply, fill #0

## 2022-04-09 ENCOUNTER — Other Ambulatory Visit (HOSPITAL_COMMUNITY): Payer: Self-pay

## 2022-04-09 MED ORDER — BUSPIRONE HCL 15 MG PO TABS
15.0000 mg | ORAL_TABLET | Freq: Three times a day (TID) | ORAL | 0 refills | Status: AC
  Filled 2022-04-09: qty 90, 30d supply, fill #0

## 2022-04-09 MED ORDER — GABAPENTIN 300 MG PO CAPS
600.0000 mg | ORAL_CAPSULE | Freq: Three times a day (TID) | ORAL | 2 refills | Status: AC
  Filled 2022-04-09 – 2022-04-28 (×2): qty 180, 30d supply, fill #0
  Filled 2022-05-28: qty 180, 30d supply, fill #1
  Filled 2022-06-25: qty 180, 30d supply, fill #2

## 2022-04-09 MED ORDER — SERTRALINE HCL 100 MG PO TABS
200.0000 mg | ORAL_TABLET | Freq: Every morning | ORAL | 0 refills | Status: DC
  Filled 2022-04-09: qty 180, 90d supply, fill #0

## 2022-04-10 ENCOUNTER — Other Ambulatory Visit: Payer: Self-pay

## 2022-04-10 ENCOUNTER — Other Ambulatory Visit (HOSPITAL_COMMUNITY): Payer: Self-pay

## 2022-04-27 ENCOUNTER — Other Ambulatory Visit (HOSPITAL_COMMUNITY): Payer: Self-pay

## 2022-04-28 ENCOUNTER — Other Ambulatory Visit (HOSPITAL_COMMUNITY): Payer: Self-pay

## 2022-04-28 MED ORDER — OMEPRAZOLE 20 MG PO CPDR
20.0000 mg | DELAYED_RELEASE_CAPSULE | Freq: Two times a day (BID) | ORAL | 0 refills | Status: DC
  Filled 2022-04-28: qty 180, 90d supply, fill #0

## 2022-04-30 ENCOUNTER — Other Ambulatory Visit (HOSPITAL_COMMUNITY): Payer: Self-pay

## 2022-04-30 MED ORDER — BUPRENORPHINE HCL-NALOXONE HCL 8-2 MG SL FILM
1.0000 | ORAL_FILM | Freq: Three times a day (TID) | SUBLINGUAL | 0 refills | Status: DC
  Filled 2022-04-30: qty 90, 30d supply, fill #0

## 2022-04-30 MED ORDER — VALACYCLOVIR HCL 500 MG PO TABS
500.0000 mg | ORAL_TABLET | Freq: Two times a day (BID) | ORAL | 5 refills | Status: DC
  Filled 2022-04-30 – 2022-07-16 (×2): qty 6, 3d supply, fill #0
  Filled 2022-08-01: qty 6, 3d supply, fill #1
  Filled 2022-09-01: qty 6, 3d supply, fill #2
  Filled 2022-10-02: qty 6, 3d supply, fill #3
  Filled 2022-10-23: qty 6, 3d supply, fill #4
  Filled 2022-11-12: qty 6, 3d supply, fill #5

## 2022-05-29 ENCOUNTER — Other Ambulatory Visit (HOSPITAL_COMMUNITY): Payer: Self-pay

## 2022-05-29 MED ORDER — BUPRENORPHINE HCL-NALOXONE HCL 8-2 MG SL FILM
1.0000 | ORAL_FILM | Freq: Three times a day (TID) | SUBLINGUAL | 0 refills | Status: DC
  Filled 2022-05-29: qty 90, 30d supply, fill #0

## 2022-06-03 ENCOUNTER — Other Ambulatory Visit: Payer: Self-pay

## 2022-06-03 ENCOUNTER — Other Ambulatory Visit (HOSPITAL_COMMUNITY): Payer: Self-pay

## 2022-06-03 MED ORDER — DAPAGLIFLOZIN PROPANEDIOL 10 MG PO TABS
10.0000 mg | ORAL_TABLET | Freq: Every day | ORAL | 5 refills | Status: DC
  Filled 2022-06-03: qty 30, 30d supply, fill #0
  Filled 2022-06-30: qty 30, 30d supply, fill #1
  Filled 2022-08-01: qty 30, 30d supply, fill #2
  Filled 2022-09-01: qty 30, 30d supply, fill #3
  Filled 2022-09-30: qty 30, 30d supply, fill #4
  Filled 2022-10-31: qty 30, 30d supply, fill #5

## 2022-06-09 ENCOUNTER — Other Ambulatory Visit: Payer: Self-pay

## 2022-06-18 ENCOUNTER — Other Ambulatory Visit (HOSPITAL_COMMUNITY): Payer: Self-pay

## 2022-06-18 MED ORDER — FAMOTIDINE 20 MG PO TABS
20.0000 mg | ORAL_TABLET | Freq: Two times a day (BID) | ORAL | 3 refills | Status: DC | PRN
  Filled 2022-06-18: qty 180, 90d supply, fill #0
  Filled 2022-09-21: qty 180, 90d supply, fill #1
  Filled 2022-12-18: qty 180, 90d supply, fill #2
  Filled 2023-03-16: qty 180, 90d supply, fill #3

## 2022-06-24 ENCOUNTER — Other Ambulatory Visit (HOSPITAL_COMMUNITY): Payer: Self-pay

## 2022-06-24 MED ORDER — BUSPIRONE HCL 15 MG PO TABS
15.0000 mg | ORAL_TABLET | Freq: Three times a day (TID) | ORAL | 0 refills | Status: AC
  Filled 2022-06-24: qty 90, 30d supply, fill #0

## 2022-06-24 MED ORDER — GABAPENTIN 300 MG PO CAPS
600.0000 mg | ORAL_CAPSULE | Freq: Three times a day (TID) | ORAL | 2 refills | Status: DC
  Filled 2022-06-24 – 2022-07-25 (×2): qty 180, 30d supply, fill #0
  Filled 2022-08-24: qty 180, 30d supply, fill #1
  Filled 2023-03-16: qty 180, 30d supply, fill #2

## 2022-06-24 MED ORDER — SERTRALINE HCL 100 MG PO TABS
200.0000 mg | ORAL_TABLET | Freq: Every morning | ORAL | 0 refills | Status: DC
  Filled 2022-06-24 – 2022-07-16 (×2): qty 180, 90d supply, fill #0

## 2022-06-25 ENCOUNTER — Other Ambulatory Visit (HOSPITAL_COMMUNITY): Payer: Self-pay

## 2022-06-26 ENCOUNTER — Other Ambulatory Visit: Payer: Self-pay

## 2022-06-26 ENCOUNTER — Other Ambulatory Visit (HOSPITAL_COMMUNITY): Payer: Self-pay

## 2022-06-26 MED ORDER — BUPRENORPHINE HCL-NALOXONE HCL 8-2 MG SL FILM
1.0000 | ORAL_FILM | Freq: Three times a day (TID) | SUBLINGUAL | 0 refills | Status: DC
  Filled 2022-06-26: qty 60, 20d supply, fill #0
  Filled 2022-06-26: qty 90, 30d supply, fill #0
  Filled 2022-06-26: qty 30, 10d supply, fill #0

## 2022-07-16 ENCOUNTER — Other Ambulatory Visit (HOSPITAL_COMMUNITY): Payer: Self-pay

## 2022-07-24 ENCOUNTER — Other Ambulatory Visit (HOSPITAL_COMMUNITY): Payer: Self-pay

## 2022-07-24 MED ORDER — NEOMYCIN-POLYMYXIN-HC 3.5-10000-1 OP SUSP
3.0000 [drp] | Freq: Four times a day (QID) | OPHTHALMIC | 0 refills | Status: AC
  Filled 2022-07-24: qty 7.5, 13d supply, fill #0

## 2022-07-24 MED ORDER — BUPRENORPHINE HCL-NALOXONE HCL 8-2 MG SL FILM
1.0000 | ORAL_FILM | Freq: Three times a day (TID) | SUBLINGUAL | 0 refills | Status: DC
  Filled 2022-07-24: qty 90, 30d supply, fill #0

## 2022-07-24 MED ORDER — OMEPRAZOLE 20 MG PO CPDR
20.0000 mg | DELAYED_RELEASE_CAPSULE | Freq: Two times a day (BID) | ORAL | 0 refills | Status: DC
  Filled 2022-07-24: qty 180, 90d supply, fill #0

## 2022-07-25 ENCOUNTER — Other Ambulatory Visit (HOSPITAL_COMMUNITY): Payer: Self-pay

## 2022-08-01 ENCOUNTER — Other Ambulatory Visit (HOSPITAL_COMMUNITY): Payer: Self-pay

## 2022-08-01 MED ORDER — FENOFIBRATE 145 MG PO TABS
145.0000 mg | ORAL_TABLET | Freq: Every day | ORAL | 1 refills | Status: DC
  Filled 2022-08-01: qty 90, 90d supply, fill #0
  Filled 2022-11-12: qty 90, 90d supply, fill #1

## 2022-08-21 ENCOUNTER — Other Ambulatory Visit (HOSPITAL_COMMUNITY): Payer: Self-pay

## 2022-08-21 MED ORDER — BUPRENORPHINE HCL-NALOXONE HCL 8-2 MG SL FILM
1.0000 | ORAL_FILM | Freq: Three times a day (TID) | SUBLINGUAL | 0 refills | Status: DC
  Filled 2022-08-21: qty 90, 30d supply, fill #0

## 2022-09-02 ENCOUNTER — Other Ambulatory Visit (HOSPITAL_COMMUNITY): Payer: Self-pay

## 2022-09-02 MED ORDER — ATORVASTATIN CALCIUM 10 MG PO TABS
10.0000 mg | ORAL_TABLET | Freq: Every day | ORAL | 1 refills | Status: DC
  Filled 2022-09-02: qty 90, 90d supply, fill #0
  Filled 2022-11-27: qty 90, 90d supply, fill #1

## 2022-09-15 ENCOUNTER — Other Ambulatory Visit (HOSPITAL_COMMUNITY): Payer: Self-pay

## 2022-09-16 ENCOUNTER — Other Ambulatory Visit (HOSPITAL_COMMUNITY): Payer: Self-pay

## 2022-09-16 MED ORDER — OMEGA-3-ACID ETHYL ESTERS 1 G PO CAPS
1.0000 g | ORAL_CAPSULE | Freq: Every day | ORAL | 1 refills | Status: DC
  Filled 2022-09-16: qty 90, 90d supply, fill #0
  Filled 2022-12-18 – 2023-03-16 (×2): qty 90, 90d supply, fill #1

## 2022-09-18 ENCOUNTER — Other Ambulatory Visit (HOSPITAL_COMMUNITY): Payer: Self-pay

## 2022-09-18 MED ORDER — BUSPIRONE HCL 15 MG PO TABS
15.0000 mg | ORAL_TABLET | Freq: Three times a day (TID) | ORAL | 0 refills | Status: AC
  Filled 2022-09-18: qty 90, 30d supply, fill #0

## 2022-09-18 MED ORDER — GABAPENTIN 300 MG PO CAPS
600.0000 mg | ORAL_CAPSULE | Freq: Three times a day (TID) | ORAL | 2 refills | Status: AC
  Filled 2022-09-18: qty 180, 30d supply, fill #0
  Filled 2022-10-17 (×2): qty 180, 30d supply, fill #1
  Filled 2022-11-17: qty 180, 30d supply, fill #2

## 2022-09-18 MED ORDER — SERTRALINE HCL 100 MG PO TABS
200.0000 mg | ORAL_TABLET | Freq: Every morning | ORAL | 0 refills | Status: DC
  Filled 2022-09-18: qty 60, 30d supply, fill #0
  Filled 2022-10-17 (×2): qty 180, 90d supply, fill #0

## 2022-09-23 ENCOUNTER — Other Ambulatory Visit (HOSPITAL_COMMUNITY): Payer: Self-pay

## 2022-09-23 MED ORDER — BUPRENORPHINE HCL-NALOXONE HCL 8-2 MG SL FILM
1.0000 | ORAL_FILM | Freq: Three times a day (TID) | SUBLINGUAL | 0 refills | Status: DC
  Filled 2022-09-23: qty 90, 30d supply, fill #0

## 2022-10-17 ENCOUNTER — Other Ambulatory Visit (HOSPITAL_COMMUNITY): Payer: Self-pay

## 2022-10-17 ENCOUNTER — Other Ambulatory Visit: Payer: Self-pay

## 2022-10-23 ENCOUNTER — Other Ambulatory Visit (HOSPITAL_COMMUNITY): Payer: Self-pay

## 2022-10-23 MED ORDER — OMEPRAZOLE 20 MG PO CPDR
20.0000 mg | DELAYED_RELEASE_CAPSULE | Freq: Two times a day (BID) | ORAL | 0 refills | Status: DC
  Filled 2022-10-23: qty 180, 90d supply, fill #0

## 2022-10-24 ENCOUNTER — Other Ambulatory Visit (HOSPITAL_COMMUNITY): Payer: Self-pay

## 2022-10-24 MED ORDER — BUPRENORPHINE HCL-NALOXONE HCL 8-2 MG SL FILM
1.0000 | ORAL_FILM | Freq: Three times a day (TID) | SUBLINGUAL | 0 refills | Status: DC
  Filled 2022-10-24: qty 90, 30d supply, fill #0

## 2022-11-12 ENCOUNTER — Other Ambulatory Visit: Payer: Self-pay

## 2022-11-12 ENCOUNTER — Other Ambulatory Visit (HOSPITAL_COMMUNITY): Payer: Self-pay

## 2022-11-21 ENCOUNTER — Other Ambulatory Visit (HOSPITAL_COMMUNITY): Payer: Self-pay

## 2022-11-21 MED ORDER — VALACYCLOVIR HCL 500 MG PO TABS
500.0000 mg | ORAL_TABLET | Freq: Two times a day (BID) | ORAL | 5 refills | Status: AC
  Filled 2022-11-21: qty 6, 3d supply, fill #0
  Filled 2022-12-18 – 2022-12-19 (×2): qty 6, 3d supply, fill #1
  Filled 2023-01-20: qty 6, 3d supply, fill #2
  Filled 2023-05-13: qty 6, 3d supply, fill #3
  Filled 2023-05-30: qty 6, 3d supply, fill #4
  Filled 2023-07-27: qty 6, 3d supply, fill #0

## 2022-11-21 MED ORDER — BUPRENORPHINE HCL-NALOXONE HCL 8-2 MG SL FILM
1.0000 | ORAL_FILM | Freq: Three times a day (TID) | SUBLINGUAL | 0 refills | Status: DC
  Filled 2022-11-21: qty 90, 30d supply, fill #0

## 2022-11-21 MED ORDER — ZTLIDO 1.8 % EX PTCH
1.0000 | MEDICATED_PATCH | Freq: Every day | CUTANEOUS | 11 refills | Status: AC
Start: 2022-11-21 — End: ?
  Filled 2022-11-21: qty 30, 30d supply, fill #0

## 2022-11-26 ENCOUNTER — Other Ambulatory Visit (HOSPITAL_COMMUNITY): Payer: Self-pay

## 2022-11-27 ENCOUNTER — Other Ambulatory Visit (HOSPITAL_COMMUNITY): Payer: Self-pay

## 2022-11-28 ENCOUNTER — Other Ambulatory Visit (HOSPITAL_COMMUNITY): Payer: Self-pay

## 2022-11-28 MED ORDER — DAPAGLIFLOZIN PROPANEDIOL 10 MG PO TABS
10.0000 mg | ORAL_TABLET | Freq: Every day | ORAL | 5 refills | Status: DC
  Filled 2022-11-28: qty 30, 30d supply, fill #0
  Filled 2022-12-29: qty 30, 30d supply, fill #1
  Filled 2023-01-25: qty 30, 30d supply, fill #2
  Filled 2023-02-26: qty 30, 30d supply, fill #3
  Filled 2023-03-27: qty 30, 30d supply, fill #4
  Filled 2023-04-27: qty 30, 30d supply, fill #5

## 2022-12-08 ENCOUNTER — Other Ambulatory Visit (HOSPITAL_COMMUNITY): Payer: Self-pay

## 2022-12-18 ENCOUNTER — Other Ambulatory Visit: Payer: Self-pay

## 2022-12-18 ENCOUNTER — Other Ambulatory Visit (HOSPITAL_COMMUNITY): Payer: Self-pay

## 2022-12-18 MED ORDER — BUSPIRONE HCL 15 MG PO TABS
15.0000 mg | ORAL_TABLET | Freq: Three times a day (TID) | ORAL | 0 refills | Status: AC
  Filled 2022-12-18: qty 90, 30d supply, fill #0
  Filled 2023-06-10: qty 90, 30d supply, fill #1

## 2022-12-18 MED ORDER — GABAPENTIN 300 MG PO CAPS
600.0000 mg | ORAL_CAPSULE | Freq: Three times a day (TID) | ORAL | 2 refills | Status: AC
  Filled 2022-12-18: qty 180, 30d supply, fill #0
  Filled 2023-01-20: qty 180, 30d supply, fill #1
  Filled 2023-02-16: qty 180, 30d supply, fill #2

## 2022-12-18 MED ORDER — SERTRALINE HCL 100 MG PO TABS
200.0000 mg | ORAL_TABLET | Freq: Every morning | ORAL | 0 refills | Status: DC
  Filled 2022-12-18: qty 60, 30d supply, fill #0
  Filled 2023-01-20: qty 180, 90d supply, fill #0

## 2022-12-19 ENCOUNTER — Other Ambulatory Visit (HOSPITAL_COMMUNITY): Payer: Self-pay

## 2022-12-19 MED ORDER — FENOFIBRATE 145 MG PO TABS
145.0000 mg | ORAL_TABLET | Freq: Every day | ORAL | 1 refills | Status: DC
Start: 2022-12-19 — End: 2023-07-30
  Filled 2023-02-05: qty 90, 90d supply, fill #0
  Filled 2023-05-08: qty 90, 90d supply, fill #1

## 2022-12-19 MED ORDER — FAMOTIDINE 20 MG PO TABS: 20.0000 mg | ORAL_TABLET | Freq: Two times a day (BID) | ORAL | 3 refills | Status: AC | PRN

## 2022-12-19 MED ORDER — TRIAMCINOLONE ACETONIDE 0.1 % EX CREA
1.0000 | TOPICAL_CREAM | Freq: Two times a day (BID) | CUTANEOUS | 2 refills | Status: AC | PRN
  Filled 2022-12-19: qty 80, 24d supply, fill #0
  Filled 2023-01-20: qty 80, 24d supply, fill #1
  Filled 2023-09-07: qty 80, 24d supply, fill #0

## 2022-12-19 MED ORDER — ATORVASTATIN CALCIUM 10 MG PO TABS
10.0000 mg | ORAL_TABLET | Freq: Every day | ORAL | 1 refills | Status: DC
Start: 2022-12-19 — End: 2023-08-06
  Filled 2023-02-26: qty 90, 90d supply, fill #0
  Filled 2023-05-29: qty 90, 90d supply, fill #1

## 2022-12-19 MED ORDER — OMEPRAZOLE 20 MG PO CPDR
20.0000 mg | DELAYED_RELEASE_CAPSULE | Freq: Two times a day (BID) | ORAL | 0 refills | Status: DC
Start: 2022-12-19 — End: 2023-04-15
  Filled 2023-01-20 – 2023-01-23 (×2): qty 180, 90d supply, fill #0

## 2022-12-19 MED ORDER — OMEGA-3-ACID ETHYL ESTERS 1 G PO CAPS
1.0000 g | ORAL_CAPSULE | Freq: Every day | ORAL | 1 refills | Status: AC
  Filled 2022-12-19 – 2023-12-17 (×2): qty 90, 90d supply, fill #0

## 2022-12-22 ENCOUNTER — Other Ambulatory Visit (HOSPITAL_COMMUNITY): Payer: Self-pay

## 2022-12-22 MED ORDER — BUPRENORPHINE HCL-NALOXONE HCL 8-2 MG SL FILM
1.0000 | ORAL_FILM | Freq: Three times a day (TID) | SUBLINGUAL | 0 refills | Status: DC
  Filled 2022-12-22: qty 90, 30d supply, fill #0

## 2022-12-22 MED ORDER — ZTLIDO 1.8 % EX PTCH
1.0000 | MEDICATED_PATCH | Freq: Every day | CUTANEOUS | 11 refills | Status: AC
  Filled 2022-12-22 (×2): qty 30, 30d supply, fill #0

## 2022-12-29 ENCOUNTER — Other Ambulatory Visit (HOSPITAL_COMMUNITY): Payer: Self-pay

## 2023-01-20 ENCOUNTER — Other Ambulatory Visit (HOSPITAL_COMMUNITY): Payer: Self-pay

## 2023-01-20 ENCOUNTER — Other Ambulatory Visit: Payer: Self-pay

## 2023-01-21 ENCOUNTER — Other Ambulatory Visit (HOSPITAL_COMMUNITY): Payer: Self-pay

## 2023-01-21 MED ORDER — BUPRENORPHINE HCL-NALOXONE HCL 8-2 MG SL FILM
1.0000 | ORAL_FILM | Freq: Three times a day (TID) | SUBLINGUAL | 0 refills | Status: DC
Start: 2023-01-21 — End: 2023-02-18
  Filled 2023-01-21: qty 90, 30d supply, fill #0

## 2023-01-23 ENCOUNTER — Other Ambulatory Visit (HOSPITAL_COMMUNITY): Payer: Self-pay

## 2023-01-27 ENCOUNTER — Other Ambulatory Visit (HOSPITAL_COMMUNITY): Payer: Self-pay

## 2023-02-05 ENCOUNTER — Other Ambulatory Visit (HOSPITAL_COMMUNITY): Payer: Self-pay

## 2023-02-05 ENCOUNTER — Other Ambulatory Visit: Payer: Self-pay

## 2023-02-18 ENCOUNTER — Other Ambulatory Visit (HOSPITAL_COMMUNITY): Payer: Self-pay

## 2023-02-18 MED ORDER — BUPRENORPHINE HCL-NALOXONE HCL 8-2 MG SL FILM
1.0000 | ORAL_FILM | Freq: Three times a day (TID) | SUBLINGUAL | 0 refills | Status: DC
Start: 2023-02-18 — End: 2023-03-19
  Filled 2023-02-18: qty 90, 30d supply, fill #0

## 2023-02-26 ENCOUNTER — Other Ambulatory Visit (HOSPITAL_COMMUNITY): Payer: Self-pay

## 2023-03-16 ENCOUNTER — Other Ambulatory Visit (HOSPITAL_COMMUNITY): Payer: Self-pay

## 2023-03-19 ENCOUNTER — Other Ambulatory Visit (HOSPITAL_COMMUNITY): Payer: Self-pay

## 2023-03-19 MED ORDER — BUPRENORPHINE HCL-NALOXONE HCL 8-2 MG SL FILM
1.0000 | ORAL_FILM | Freq: Three times a day (TID) | SUBLINGUAL | 0 refills | Status: DC
Start: 2023-03-19 — End: 2023-04-16
  Filled 2023-03-19: qty 90, 30d supply, fill #0

## 2023-03-27 ENCOUNTER — Other Ambulatory Visit (HOSPITAL_COMMUNITY): Payer: Self-pay

## 2023-04-10 ENCOUNTER — Encounter (HOSPITAL_COMMUNITY): Payer: Self-pay

## 2023-04-10 ENCOUNTER — Other Ambulatory Visit (HOSPITAL_COMMUNITY): Payer: Self-pay

## 2023-04-10 MED ORDER — GABAPENTIN 300 MG PO CAPS
600.0000 mg | ORAL_CAPSULE | Freq: Three times a day (TID) | ORAL | 2 refills | Status: DC
  Filled 2023-04-10 – 2023-04-15 (×2): qty 180, 30d supply, fill #0
  Filled 2023-05-13: qty 180, 30d supply, fill #1
  Filled 2023-06-10: qty 180, 30d supply, fill #2
  Filled 2023-06-12: qty 180, 30d supply, fill #0
  Filled ????-??-??: fill #0

## 2023-04-10 MED ORDER — SERTRALINE HCL 100 MG PO TABS
200.0000 mg | ORAL_TABLET | Freq: Every morning | ORAL | 0 refills | Status: DC
  Filled 2023-04-10 – 2023-04-15 (×2): qty 180, 90d supply, fill #0

## 2023-04-10 MED ORDER — BUSPIRONE HCL 15 MG PO TABS
15.0000 mg | ORAL_TABLET | Freq: Three times a day (TID) | ORAL | 0 refills | Status: AC
  Filled 2023-04-10 – 2023-06-12 (×2): qty 90, 30d supply, fill #0
  Filled 2024-03-07: qty 90, 30d supply, fill #1

## 2023-04-15 ENCOUNTER — Other Ambulatory Visit (HOSPITAL_COMMUNITY): Payer: Self-pay

## 2023-04-15 ENCOUNTER — Other Ambulatory Visit: Payer: Self-pay

## 2023-04-15 MED ORDER — OMEPRAZOLE 20 MG PO CPDR
20.0000 mg | DELAYED_RELEASE_CAPSULE | Freq: Two times a day (BID) | ORAL | 0 refills | Status: DC
  Filled 2023-04-15: qty 180, 90d supply, fill #0

## 2023-04-16 ENCOUNTER — Other Ambulatory Visit (HOSPITAL_COMMUNITY): Payer: Self-pay

## 2023-04-16 MED ORDER — BUPRENORPHINE HCL-NALOXONE HCL 8-2 MG SL FILM
1.0000 | ORAL_FILM | Freq: Three times a day (TID) | SUBLINGUAL | 0 refills | Status: DC
  Filled 2023-04-16: qty 90, 30d supply, fill #0

## 2023-04-20 ENCOUNTER — Other Ambulatory Visit (HOSPITAL_COMMUNITY): Payer: Self-pay

## 2023-04-20 MED ORDER — ALBUTEROL SULFATE HFA 108 (90 BASE) MCG/ACT IN AERS
1.0000 | INHALATION_SPRAY | RESPIRATORY_TRACT | 5 refills | Status: AC | PRN
  Filled 2023-04-20: qty 18, 34d supply, fill #0
  Filled 2023-05-30: qty 18, 34d supply, fill #1
  Filled 2023-08-07 – 2023-08-08 (×3): qty 18, 34d supply, fill #0
  Filled 2024-01-04: qty 18, 34d supply, fill #1

## 2023-04-22 ENCOUNTER — Other Ambulatory Visit (HOSPITAL_COMMUNITY): Payer: Self-pay

## 2023-05-14 ENCOUNTER — Other Ambulatory Visit (HOSPITAL_COMMUNITY): Payer: Self-pay

## 2023-05-14 MED ORDER — BUPRENORPHINE HCL-NALOXONE HCL 8-2 MG SL FILM
1.0000 | ORAL_FILM | Freq: Three times a day (TID) | SUBLINGUAL | 0 refills | Status: DC
  Filled 2023-05-14: qty 90, 30d supply, fill #0

## 2023-05-14 MED ORDER — NALOXONE HCL 4 MG/0.1ML NA LIQD
1.0000 | NASAL | 1 refills | Status: AC
  Filled 2023-05-14: qty 2, 1d supply, fill #0

## 2023-05-26 ENCOUNTER — Other Ambulatory Visit (HOSPITAL_COMMUNITY): Payer: Self-pay

## 2023-05-29 ENCOUNTER — Other Ambulatory Visit (HOSPITAL_COMMUNITY): Payer: Self-pay

## 2023-05-29 MED ORDER — DAPAGLIFLOZIN PROPANEDIOL 10 MG PO TABS
10.0000 mg | ORAL_TABLET | Freq: Every day | ORAL | 5 refills | Status: DC
  Filled 2023-05-29 – 2023-07-01 (×2): qty 30, 30d supply, fill #0
  Filled 2023-07-27: qty 30, 30d supply, fill #1
  Filled 2023-08-25: qty 30, 30d supply, fill #2
  Filled 2023-09-27: qty 30, 30d supply, fill #3
  Filled 2023-10-26: qty 30, 30d supply, fill #4

## 2023-06-10 ENCOUNTER — Other Ambulatory Visit (HOSPITAL_COMMUNITY): Payer: Self-pay

## 2023-06-10 MED ORDER — OMEGA-3-ACID ETHYL ESTERS 1 G PO CAPS
1.0000 g | ORAL_CAPSULE | Freq: Every day | ORAL | 1 refills | Status: DC
  Filled 2023-06-10 – 2023-06-12 (×2): qty 90, 90d supply, fill #0
  Filled 2023-09-16: qty 90, 90d supply, fill #1

## 2023-06-10 MED ORDER — FAMOTIDINE 20 MG PO TABS
20.0000 mg | ORAL_TABLET | Freq: Two times a day (BID) | ORAL | 3 refills | Status: AC | PRN
  Filled 2023-06-10 – 2023-06-12 (×2): qty 180, 90d supply, fill #0
  Filled 2023-09-07: qty 180, 90d supply, fill #1
  Filled 2023-12-18: qty 180, 90d supply, fill #2
  Filled 2023-12-18: qty 60, 30d supply, fill #2
  Filled 2024-01-17: qty 60, 30d supply, fill #3
  Filled 2024-02-21: qty 60, 30d supply, fill #4
  Filled 2024-03-21: qty 60, 30d supply, fill #5
  Filled 2024-04-20: qty 60, 30d supply, fill #6

## 2023-06-12 ENCOUNTER — Other Ambulatory Visit (HOSPITAL_BASED_OUTPATIENT_CLINIC_OR_DEPARTMENT_OTHER): Payer: Self-pay

## 2023-06-12 ENCOUNTER — Other Ambulatory Visit (HOSPITAL_COMMUNITY): Payer: Self-pay

## 2023-06-13 ENCOUNTER — Other Ambulatory Visit (HOSPITAL_BASED_OUTPATIENT_CLINIC_OR_DEPARTMENT_OTHER): Payer: Self-pay

## 2023-06-13 MED ORDER — BUPRENORPHINE HCL-NALOXONE HCL 8-2 MG SL FILM
1.0000 | ORAL_FILM | Freq: Three times a day (TID) | SUBLINGUAL | 0 refills | Status: DC
  Filled 2023-06-13: qty 81, 27d supply, fill #0
  Filled 2023-06-13: qty 9, 3d supply, fill #0

## 2023-06-15 ENCOUNTER — Other Ambulatory Visit: Payer: Self-pay

## 2023-06-15 ENCOUNTER — Other Ambulatory Visit (HOSPITAL_BASED_OUTPATIENT_CLINIC_OR_DEPARTMENT_OTHER): Payer: Self-pay

## 2023-07-01 ENCOUNTER — Other Ambulatory Visit: Payer: Self-pay

## 2023-07-01 ENCOUNTER — Other Ambulatory Visit (HOSPITAL_BASED_OUTPATIENT_CLINIC_OR_DEPARTMENT_OTHER): Payer: Self-pay

## 2023-07-02 ENCOUNTER — Other Ambulatory Visit (HOSPITAL_COMMUNITY): Payer: Self-pay

## 2023-07-02 ENCOUNTER — Other Ambulatory Visit: Payer: Self-pay

## 2023-07-02 MED ORDER — BUSPIRONE HCL 15 MG PO TABS
15.0000 mg | ORAL_TABLET | Freq: Three times a day (TID) | ORAL | 0 refills | Status: AC
  Filled 2023-07-02 – 2023-09-07 (×2): qty 90, 30d supply, fill #0

## 2023-07-02 MED ORDER — GABAPENTIN 300 MG PO CAPS
600.0000 mg | ORAL_CAPSULE | Freq: Three times a day (TID) | ORAL | 2 refills | Status: DC
  Filled 2023-07-02 – 2023-07-11 (×3): qty 180, 30d supply, fill #0
  Filled 2023-08-07: qty 180, 30d supply, fill #1
  Filled 2023-08-08 (×2): qty 180, 30d supply, fill #0
  Filled 2023-09-07: qty 180, 30d supply, fill #1

## 2023-07-02 MED ORDER — SERTRALINE HCL 100 MG PO TABS
200.0000 mg | ORAL_TABLET | Freq: Every morning | ORAL | 0 refills | Status: DC
Start: 2023-07-02 — End: 2023-10-06
  Filled 2023-07-02: qty 180, 90d supply, fill #0
  Filled 2023-07-02: qty 60, 30d supply, fill #0
  Filled 2023-07-21: qty 180, 90d supply, fill #0

## 2023-07-07 ENCOUNTER — Other Ambulatory Visit (HOSPITAL_COMMUNITY): Payer: Self-pay

## 2023-07-09 ENCOUNTER — Encounter (HOSPITAL_COMMUNITY): Payer: Self-pay

## 2023-07-10 ENCOUNTER — Other Ambulatory Visit (HOSPITAL_COMMUNITY): Payer: Self-pay

## 2023-07-10 ENCOUNTER — Other Ambulatory Visit (HOSPITAL_BASED_OUTPATIENT_CLINIC_OR_DEPARTMENT_OTHER): Payer: Self-pay

## 2023-07-11 ENCOUNTER — Other Ambulatory Visit (HOSPITAL_BASED_OUTPATIENT_CLINIC_OR_DEPARTMENT_OTHER): Payer: Self-pay

## 2023-07-13 ENCOUNTER — Other Ambulatory Visit (HOSPITAL_COMMUNITY): Payer: Self-pay

## 2023-07-13 ENCOUNTER — Other Ambulatory Visit (HOSPITAL_BASED_OUTPATIENT_CLINIC_OR_DEPARTMENT_OTHER): Payer: Self-pay

## 2023-07-13 MED ORDER — BUPRENORPHINE HCL-NALOXONE HCL 8-2 MG SL FILM
1.0000 | ORAL_FILM | Freq: Three times a day (TID) | SUBLINGUAL | 0 refills | Status: DC
  Filled 2023-07-13: qty 90, 30d supply, fill #0

## 2023-07-21 ENCOUNTER — Other Ambulatory Visit (HOSPITAL_COMMUNITY): Payer: Self-pay

## 2023-07-21 ENCOUNTER — Other Ambulatory Visit (HOSPITAL_BASED_OUTPATIENT_CLINIC_OR_DEPARTMENT_OTHER): Payer: Self-pay

## 2023-07-21 MED ORDER — OMEPRAZOLE 20 MG PO CPDR
20.0000 mg | DELAYED_RELEASE_CAPSULE | Freq: Two times a day (BID) | ORAL | 0 refills | Status: DC
  Filled 2023-07-21 – 2023-07-22 (×2): qty 180, 90d supply, fill #0

## 2023-07-22 ENCOUNTER — Other Ambulatory Visit (HOSPITAL_BASED_OUTPATIENT_CLINIC_OR_DEPARTMENT_OTHER): Payer: Self-pay

## 2023-07-23 ENCOUNTER — Other Ambulatory Visit (HOSPITAL_COMMUNITY): Payer: Self-pay

## 2023-07-30 ENCOUNTER — Other Ambulatory Visit (HOSPITAL_BASED_OUTPATIENT_CLINIC_OR_DEPARTMENT_OTHER): Payer: Self-pay

## 2023-07-30 MED ORDER — FENOFIBRATE 145 MG PO TABS
145.0000 mg | ORAL_TABLET | Freq: Every day | ORAL | 1 refills | Status: DC
Start: 2023-07-30 — End: 2024-02-04
  Filled 2023-07-30: qty 90, 90d supply, fill #0
  Filled 2023-11-04 (×2): qty 90, 90d supply, fill #1

## 2023-08-04 ENCOUNTER — Other Ambulatory Visit: Payer: Self-pay

## 2023-08-04 ENCOUNTER — Other Ambulatory Visit (HOSPITAL_BASED_OUTPATIENT_CLINIC_OR_DEPARTMENT_OTHER): Payer: Self-pay

## 2023-08-04 MED ORDER — GAVILYTE-C 240 G PO SOLR
ORAL | 0 refills | Status: AC
Start: 2023-08-04 — End: ?
  Filled 2023-08-04: qty 4000, 2d supply, fill #0

## 2023-08-05 ENCOUNTER — Other Ambulatory Visit (HOSPITAL_BASED_OUTPATIENT_CLINIC_OR_DEPARTMENT_OTHER): Payer: Self-pay

## 2023-08-06 ENCOUNTER — Other Ambulatory Visit (HOSPITAL_BASED_OUTPATIENT_CLINIC_OR_DEPARTMENT_OTHER): Payer: Self-pay

## 2023-08-06 MED ORDER — OMEPRAZOLE 20 MG PO CPDR
20.0000 mg | DELAYED_RELEASE_CAPSULE | Freq: Two times a day (BID) | ORAL | 3 refills | Status: AC
  Filled 2023-08-06 – 2023-10-26 (×2): qty 180, 90d supply, fill #0
  Filled 2023-12-17 – 2024-01-17 (×2): qty 180, 90d supply, fill #1
  Filled 2024-04-20: qty 180, 90d supply, fill #2

## 2023-08-06 MED ORDER — VALACYCLOVIR HCL 1 G PO TABS
ORAL_TABLET | ORAL | 11 refills | Status: AC
Start: 2023-08-06 — End: ?
  Filled 2023-08-06: qty 4, 1d supply, fill #0
  Filled 2023-08-25: qty 4, 1d supply, fill #1
  Filled 2023-09-16: qty 4, 1d supply, fill #2
  Filled 2023-09-27: qty 4, 1d supply, fill #3
  Filled 2023-10-16: qty 4, 1d supply, fill #4
  Filled 2023-10-26: qty 4, 1d supply, fill #5
  Filled 2023-11-04 (×2): qty 4, 1d supply, fill #6
  Filled 2023-11-24: qty 4, 1d supply, fill #7
  Filled 2023-12-17: qty 4, 1d supply, fill #8
  Filled 2024-01-17: qty 4, 1d supply, fill #9

## 2023-08-06 MED ORDER — ATORVASTATIN CALCIUM 10 MG PO TABS
10.0000 mg | ORAL_TABLET | Freq: Every day | ORAL | 1 refills | Status: DC
  Filled 2023-08-06 – 2023-08-25 (×2): qty 90, 90d supply, fill #0
  Filled 2023-11-24: qty 90, 90d supply, fill #1

## 2023-08-08 ENCOUNTER — Other Ambulatory Visit (HOSPITAL_BASED_OUTPATIENT_CLINIC_OR_DEPARTMENT_OTHER): Payer: Self-pay

## 2023-08-08 ENCOUNTER — Other Ambulatory Visit (HOSPITAL_COMMUNITY): Payer: Self-pay

## 2023-08-10 ENCOUNTER — Other Ambulatory Visit (HOSPITAL_COMMUNITY): Payer: Self-pay

## 2023-08-11 ENCOUNTER — Other Ambulatory Visit (HOSPITAL_COMMUNITY): Payer: Self-pay

## 2023-08-11 ENCOUNTER — Other Ambulatory Visit (HOSPITAL_BASED_OUTPATIENT_CLINIC_OR_DEPARTMENT_OTHER): Payer: Self-pay

## 2023-08-11 MED ORDER — BUPRENORPHINE HCL-NALOXONE HCL 8-2 MG SL FILM
1.0000 | ORAL_FILM | Freq: Three times a day (TID) | SUBLINGUAL | 0 refills | Status: DC
  Filled 2023-08-11: qty 90, 30d supply, fill #0

## 2023-08-18 ENCOUNTER — Other Ambulatory Visit (HOSPITAL_COMMUNITY): Payer: Self-pay

## 2023-08-18 ENCOUNTER — Other Ambulatory Visit (HOSPITAL_BASED_OUTPATIENT_CLINIC_OR_DEPARTMENT_OTHER): Payer: Self-pay

## 2023-08-18 MED ORDER — LIDOCAINE 5 % EX PTCH
MEDICATED_PATCH | CUTANEOUS | 2 refills | Status: DC
Start: 2023-08-18 — End: 2023-11-16
  Filled 2023-08-18: qty 30, 30d supply, fill #0
  Filled 2023-09-16: qty 23, 23d supply, fill #1
  Filled 2023-09-16: qty 7, 7d supply, fill #1
  Filled 2023-10-16: qty 30, 30d supply, fill #2

## 2023-08-25 ENCOUNTER — Other Ambulatory Visit: Payer: Self-pay

## 2023-08-25 ENCOUNTER — Other Ambulatory Visit (HOSPITAL_BASED_OUTPATIENT_CLINIC_OR_DEPARTMENT_OTHER): Payer: Self-pay

## 2023-09-02 ENCOUNTER — Other Ambulatory Visit (HOSPITAL_COMMUNITY): Payer: Self-pay

## 2023-09-07 ENCOUNTER — Other Ambulatory Visit (HOSPITAL_BASED_OUTPATIENT_CLINIC_OR_DEPARTMENT_OTHER): Payer: Self-pay

## 2023-09-07 ENCOUNTER — Other Ambulatory Visit: Payer: Self-pay

## 2023-09-09 ENCOUNTER — Other Ambulatory Visit: Payer: Self-pay

## 2023-09-09 ENCOUNTER — Other Ambulatory Visit (HOSPITAL_BASED_OUTPATIENT_CLINIC_OR_DEPARTMENT_OTHER): Payer: Self-pay

## 2023-09-09 MED ORDER — BUPRENORPHINE HCL-NALOXONE HCL 8-2 MG SL FILM
1.0000 | ORAL_FILM | Freq: Three times a day (TID) | SUBLINGUAL | 0 refills | Status: DC
  Filled 2023-09-09: qty 90, 30d supply, fill #0

## 2023-09-16 ENCOUNTER — Other Ambulatory Visit (HOSPITAL_BASED_OUTPATIENT_CLINIC_OR_DEPARTMENT_OTHER): Payer: Self-pay

## 2023-09-16 ENCOUNTER — Other Ambulatory Visit: Payer: Self-pay

## 2023-09-20 IMAGING — CT CT HEAD W/O CM
4 series · 17 of 47 positions shown, 19 images · non-contrast
Comparison: None.

CLINICAL DATA: Blurred vision

EXAM:
CT HEAD WITHOUT CONTRAST
TECHNIQUE: Contiguous axial images were obtained from the base of the skull
through the vertex without intravenous contrast.

[Series 2: head wo · axial · 0.41mm/px · z∈[+989,+1109]mm · 7 of 32 slices shown, 9 images]
[im 4/32  brain]
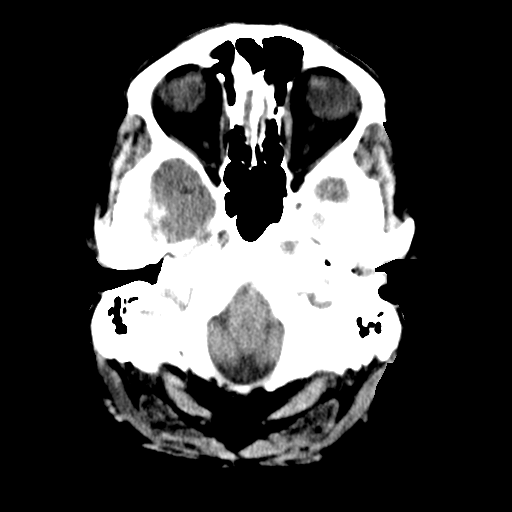
[im 4/32  bone]
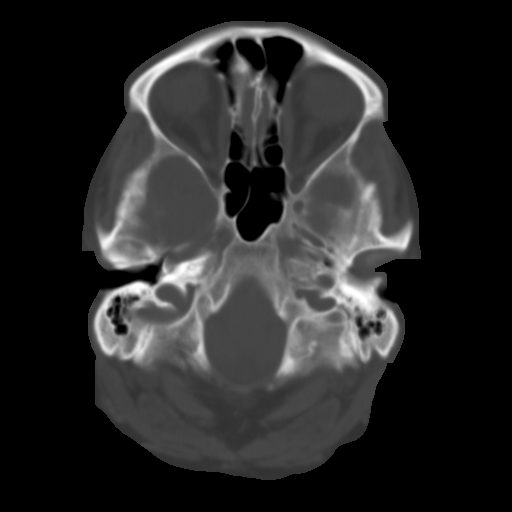
[im 8/32  brain]
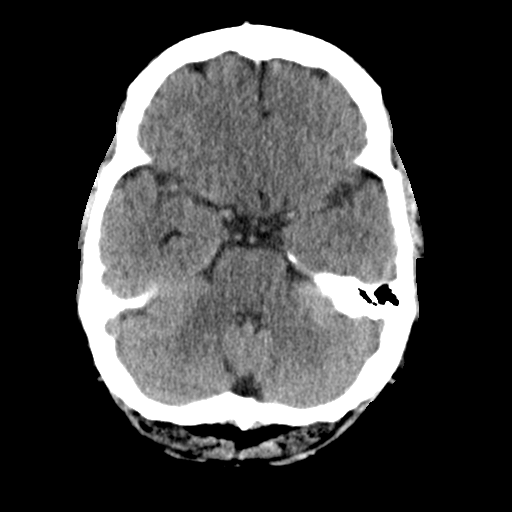
[im 12/32  brain]
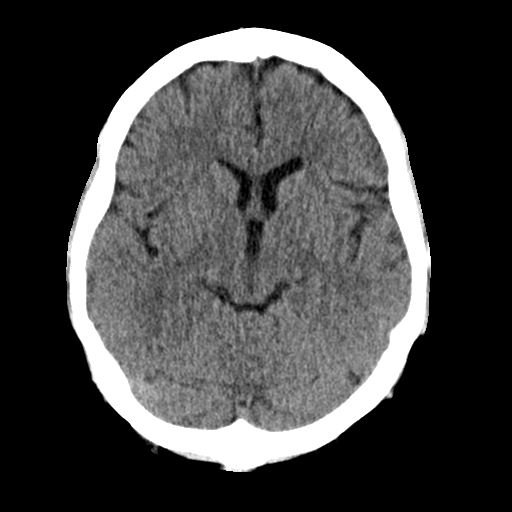
[im 16/32  brain]
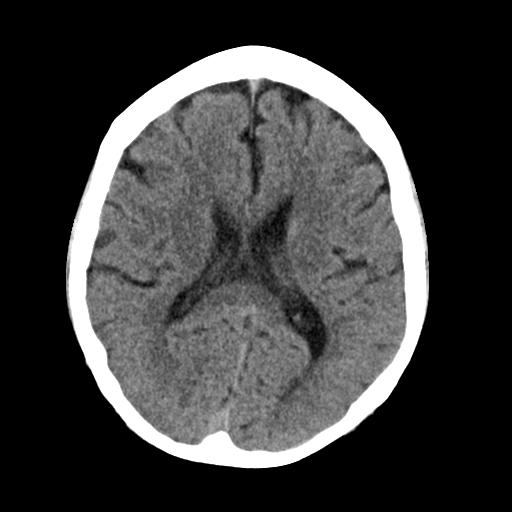
[im 20/32  brain]
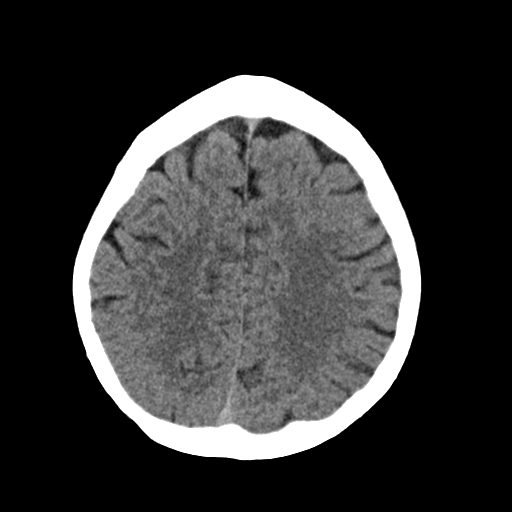
[im 20/32  bone]
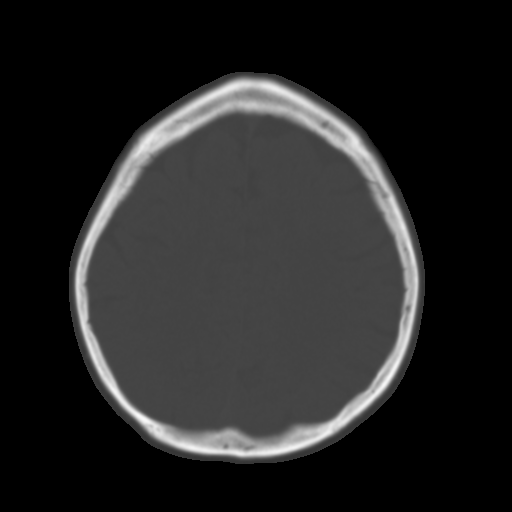
[im 24/32  brain]
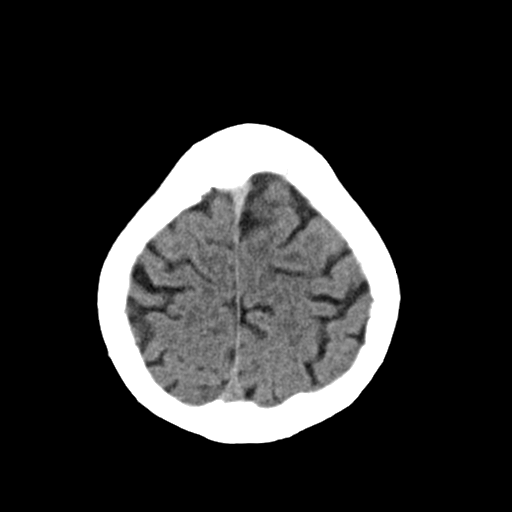
[im 28/32  brain]
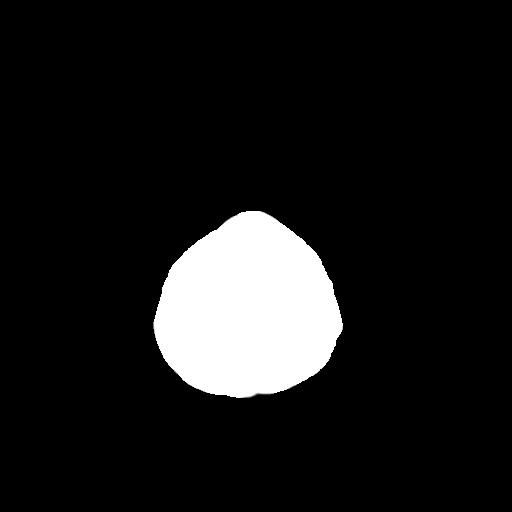

[Series 3: head bone · axial · 0.41mm/px · z∈[+988,+1042]mm · 4 of 78 slices shown]
[im 8/78  bone]
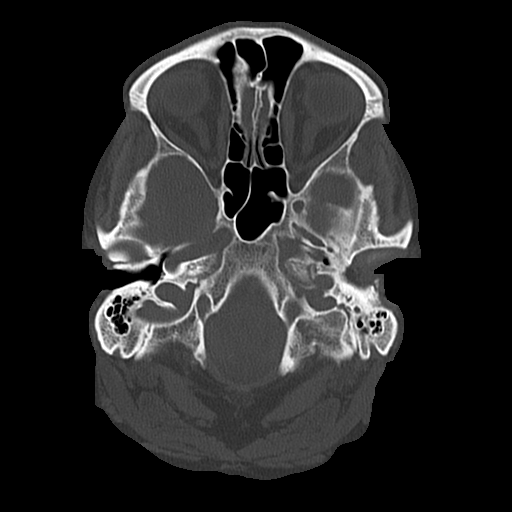
[im 16/78  bone]
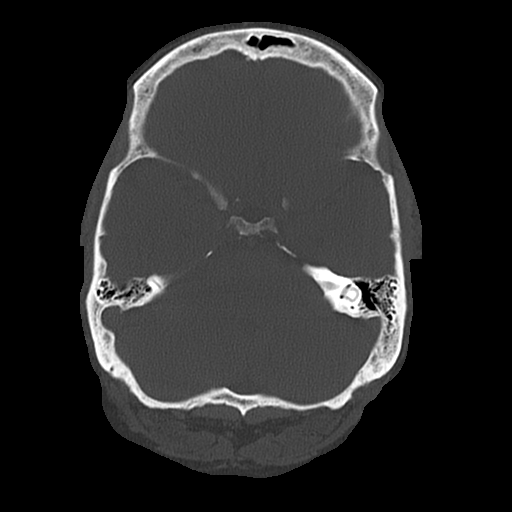
[im 24/78  bone]
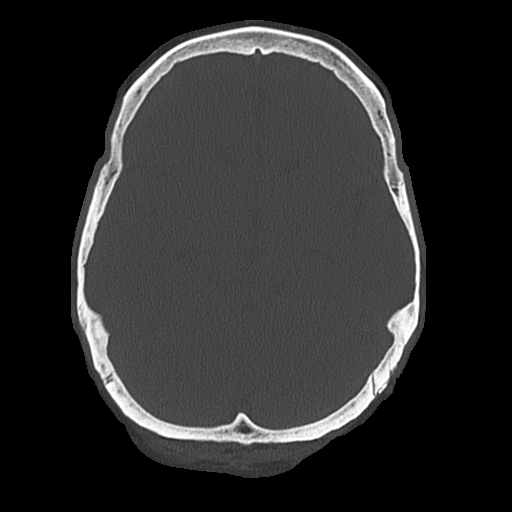
[im 35/78  bone]
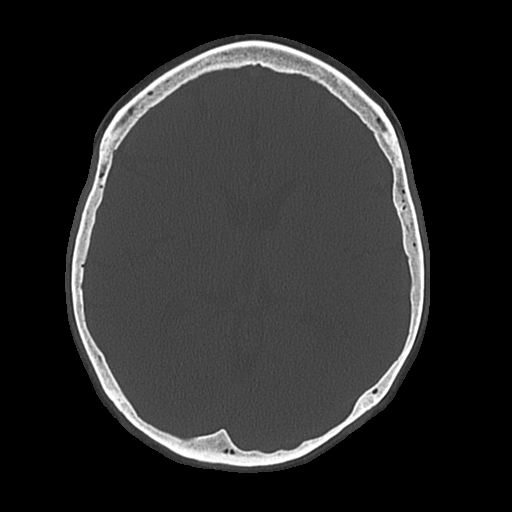

[Series 4: coronal soft · coronal · 0.32mm/px · 3 of 63 slices shown]
[im 21/63  brain]
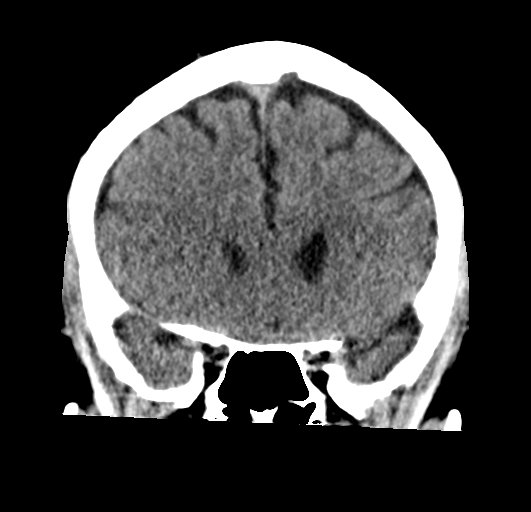
[im 28/63  brain]
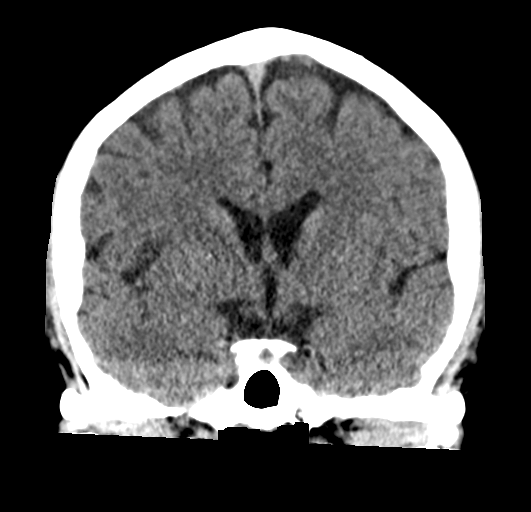
[im 35/63  brain]
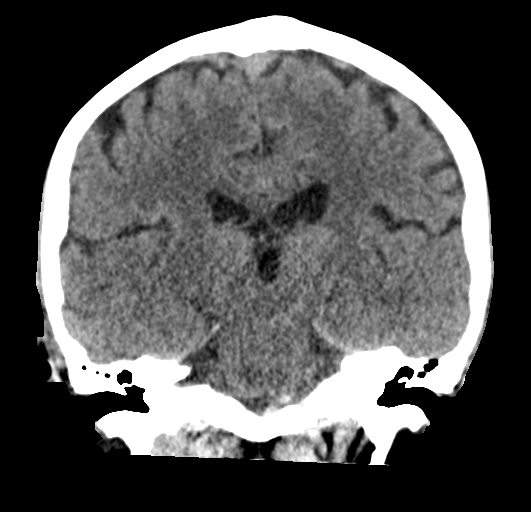

[Series 5: sagittal soft · sagittal · 0.32mm/px · 3 of 50 slices shown]
[im 17/50  brain]
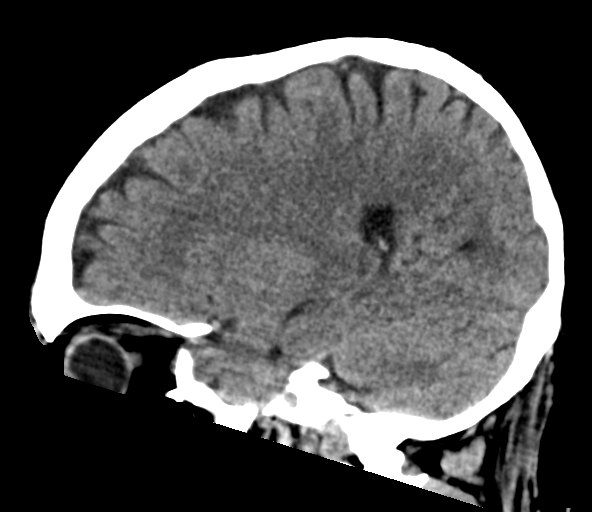
[im 25/50  brain]
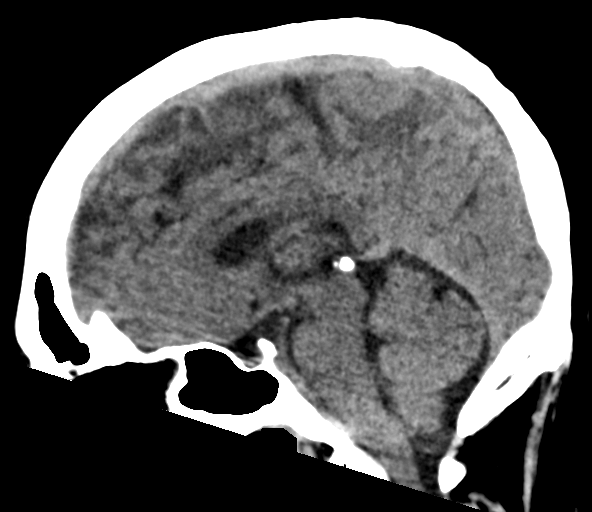
[im 33/50  brain]
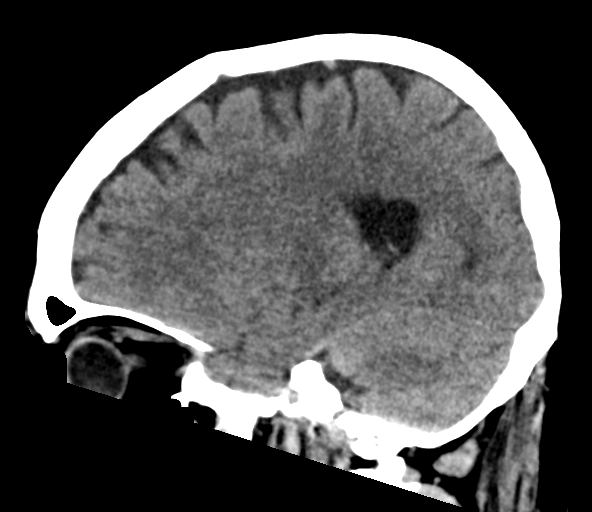

[17 of 47 positions shown; findings below may reference images not displayed]

FINDINGS: Brain: No acute intracranial hemorrhage, mass effect, or herniation.
No extra-axial fluid collections. No evidence of acute territorial
infarct. No hydrocephalus.

Vascular: No hyperdense vessel or unexpected calcification.

Skull: Normal. Negative for fracture or focal lesion.

Sinuses/Orbits: No acute finding.

Other: None.
IMPRESSION: No acute intracranial process identified.

## 2023-09-28 ENCOUNTER — Other Ambulatory Visit (HOSPITAL_BASED_OUTPATIENT_CLINIC_OR_DEPARTMENT_OTHER): Payer: Self-pay

## 2023-10-06 ENCOUNTER — Other Ambulatory Visit (HOSPITAL_BASED_OUTPATIENT_CLINIC_OR_DEPARTMENT_OTHER): Payer: Self-pay

## 2023-10-06 ENCOUNTER — Other Ambulatory Visit (HOSPITAL_COMMUNITY): Payer: Self-pay

## 2023-10-06 MED ORDER — BUSPIRONE HCL 15 MG PO TABS
15.0000 mg | ORAL_TABLET | Freq: Three times a day (TID) | ORAL | 0 refills | Status: AC
  Filled 2023-10-06: qty 270, 90d supply, fill #0

## 2023-10-06 MED ORDER — BUSPIRONE HCL 15 MG PO TABS
15.0000 mg | ORAL_TABLET | Freq: Three times a day (TID) | ORAL | 0 refills | Status: AC
  Filled 2023-10-06 (×2): qty 90, 30d supply, fill #0

## 2023-10-06 MED ORDER — GABAPENTIN 300 MG PO CAPS
600.0000 mg | ORAL_CAPSULE | Freq: Three times a day (TID) | ORAL | 2 refills | Status: AC
  Filled 2023-10-06: qty 180, 30d supply, fill #0
  Filled 2024-03-15: qty 180, 30d supply, fill #1

## 2023-10-06 MED ORDER — SERTRALINE HCL 100 MG PO TABS
200.0000 mg | ORAL_TABLET | Freq: Every morning | ORAL | 0 refills | Status: AC
  Filled 2023-10-06: qty 60, 30d supply, fill #0
  Filled 2023-11-24: qty 60, 30d supply, fill #1

## 2023-10-06 MED ORDER — SERTRALINE HCL 100 MG PO TABS
200.0000 mg | ORAL_TABLET | Freq: Every morning | ORAL | 0 refills | Status: AC
  Filled 2023-10-06 – 2023-11-04 (×5): qty 180, 90d supply, fill #0

## 2023-10-06 MED ORDER — GABAPENTIN 300 MG PO CAPS
600.0000 mg | ORAL_CAPSULE | Freq: Three times a day (TID) | ORAL | 2 refills | Status: AC
  Filled 2023-10-06 – 2023-11-04 (×3): qty 180, 30d supply, fill #0
  Filled 2023-12-17: qty 180, 30d supply, fill #1
  Filled 2024-02-13: qty 180, 30d supply, fill #2

## 2023-10-08 ENCOUNTER — Other Ambulatory Visit (HOSPITAL_BASED_OUTPATIENT_CLINIC_OR_DEPARTMENT_OTHER): Payer: Self-pay

## 2023-10-08 MED ORDER — BUPRENORPHINE HCL-NALOXONE HCL 8-2 MG SL FILM
1.0000 | ORAL_FILM | Freq: Three times a day (TID) | SUBLINGUAL | 0 refills | Status: DC
  Filled 2023-10-08: qty 90, 30d supply, fill #0

## 2023-10-16 ENCOUNTER — Other Ambulatory Visit (HOSPITAL_BASED_OUTPATIENT_CLINIC_OR_DEPARTMENT_OTHER): Payer: Self-pay

## 2023-10-16 ENCOUNTER — Other Ambulatory Visit: Payer: Self-pay

## 2023-10-17 ENCOUNTER — Other Ambulatory Visit (HOSPITAL_BASED_OUTPATIENT_CLINIC_OR_DEPARTMENT_OTHER): Payer: Self-pay

## 2023-10-26 ENCOUNTER — Other Ambulatory Visit: Payer: Self-pay

## 2023-10-26 ENCOUNTER — Other Ambulatory Visit (HOSPITAL_BASED_OUTPATIENT_CLINIC_OR_DEPARTMENT_OTHER): Payer: Self-pay

## 2023-10-27 ENCOUNTER — Other Ambulatory Visit (HOSPITAL_BASED_OUTPATIENT_CLINIC_OR_DEPARTMENT_OTHER): Payer: Self-pay

## 2023-11-04 ENCOUNTER — Other Ambulatory Visit (HOSPITAL_BASED_OUTPATIENT_CLINIC_OR_DEPARTMENT_OTHER): Payer: Self-pay

## 2023-11-05 ENCOUNTER — Other Ambulatory Visit (HOSPITAL_BASED_OUTPATIENT_CLINIC_OR_DEPARTMENT_OTHER): Payer: Self-pay

## 2023-11-05 MED ORDER — BUPRENORPHINE HCL-NALOXONE HCL 8-2 MG SL FILM
1.0000 | ORAL_FILM | Freq: Three times a day (TID) | SUBLINGUAL | 0 refills | Status: DC
  Filled 2023-11-05: qty 90, 30d supply, fill #0

## 2023-11-06 ENCOUNTER — Other Ambulatory Visit (HOSPITAL_BASED_OUTPATIENT_CLINIC_OR_DEPARTMENT_OTHER): Payer: Self-pay

## 2023-11-06 MED ORDER — BUDESONIDE-FORMOTEROL FUMARATE 80-4.5 MCG/ACT IN AERO
2.0000 | INHALATION_SPRAY | Freq: Two times a day (BID) | RESPIRATORY_TRACT | 6 refills | Status: DC
  Filled 2023-11-06: qty 10.2, 30d supply, fill #0

## 2023-11-06 MED ORDER — NICOTINE 21 MG/24HR TD PT24
21.0000 mg | MEDICATED_PATCH | TRANSDERMAL | 0 refills | Status: AC
  Filled 2023-11-06 (×2): qty 28, 28d supply, fill #0

## 2023-11-06 MED ORDER — MONTELUKAST SODIUM 10 MG PO TABS
10.0000 mg | ORAL_TABLET | Freq: Every evening | ORAL | 6 refills | Status: AC
  Filled 2023-11-06: qty 30, 30d supply, fill #0
  Filled 2023-12-17: qty 30, 30d supply, fill #1
  Filled 2024-01-17: qty 30, 30d supply, fill #2
  Filled 2024-02-13: qty 30, 30d supply, fill #3
  Filled 2024-03-15: qty 30, 30d supply, fill #4
  Filled 2024-04-13: qty 30, 30d supply, fill #5

## 2023-11-06 MED ORDER — NICOTINE 14 MG/24HR TD PT24
14.0000 mg | MEDICATED_PATCH | Freq: Every day | TRANSDERMAL | 0 refills | Status: AC
  Filled 2023-11-06: qty 14, 14d supply, fill #0

## 2023-11-06 MED ORDER — NICOTINE 21-14-7 MG/24HR TD KIT
PACK | TRANSDERMAL | 0 refills | Status: AC
  Filled 2023-11-06: qty 56, 34d supply, fill #0

## 2023-11-06 MED ORDER — NICOTINE 7 MG/24HR TD PT24
7.0000 mg | MEDICATED_PATCH | Freq: Every day | TRANSDERMAL | 0 refills | Status: AC
  Filled 2023-11-06: qty 14, 14d supply, fill #0

## 2023-11-06 MED ORDER — NYSTATIN 100000 UNIT/ML MT SUSP
400000.0000 [IU] | Freq: Four times a day (QID) | OROMUCOSAL | 0 refills | Status: AC
  Filled 2023-11-06: qty 500, 32d supply, fill #0

## 2023-11-07 ENCOUNTER — Other Ambulatory Visit (HOSPITAL_BASED_OUTPATIENT_CLINIC_OR_DEPARTMENT_OTHER): Payer: Self-pay

## 2023-11-09 ENCOUNTER — Other Ambulatory Visit: Payer: Self-pay

## 2023-11-16 ENCOUNTER — Other Ambulatory Visit (HOSPITAL_BASED_OUTPATIENT_CLINIC_OR_DEPARTMENT_OTHER): Payer: Self-pay

## 2023-11-16 MED ORDER — LIDOCAINE 5 % EX PTCH
1.0000 | MEDICATED_PATCH | Freq: Two times a day (BID) | CUTANEOUS | 2 refills | Status: DC
  Filled 2023-11-16: qty 30, 15d supply, fill #0
  Filled 2023-12-21: qty 30, 15d supply, fill #1
  Filled 2024-01-17: qty 30, 15d supply, fill #2

## 2023-11-18 ENCOUNTER — Other Ambulatory Visit (HOSPITAL_BASED_OUTPATIENT_CLINIC_OR_DEPARTMENT_OTHER): Payer: Self-pay

## 2023-11-24 ENCOUNTER — Other Ambulatory Visit (HOSPITAL_BASED_OUTPATIENT_CLINIC_OR_DEPARTMENT_OTHER): Payer: Self-pay

## 2023-11-24 MED ORDER — DAPAGLIFLOZIN PROPANEDIOL 10 MG PO TABS
10.0000 mg | ORAL_TABLET | Freq: Every day | ORAL | 5 refills | Status: AC
  Filled 2023-11-24: qty 30, 30d supply, fill #0
  Filled 2023-12-22: qty 30, 30d supply, fill #1
  Filled 2024-01-24: qty 30, 30d supply, fill #2
  Filled 2024-02-21: qty 30, 30d supply, fill #3
  Filled 2024-03-21: qty 30, 30d supply, fill #4
  Filled 2024-04-20: qty 30, 30d supply, fill #5

## 2023-11-25 ENCOUNTER — Other Ambulatory Visit (HOSPITAL_BASED_OUTPATIENT_CLINIC_OR_DEPARTMENT_OTHER): Payer: Self-pay

## 2023-11-30 ENCOUNTER — Encounter (HOSPITAL_BASED_OUTPATIENT_CLINIC_OR_DEPARTMENT_OTHER): Payer: Self-pay

## 2023-11-30 ENCOUNTER — Other Ambulatory Visit (HOSPITAL_BASED_OUTPATIENT_CLINIC_OR_DEPARTMENT_OTHER): Payer: Self-pay

## 2023-12-03 ENCOUNTER — Other Ambulatory Visit: Payer: Self-pay

## 2023-12-03 ENCOUNTER — Other Ambulatory Visit (HOSPITAL_BASED_OUTPATIENT_CLINIC_OR_DEPARTMENT_OTHER): Payer: Self-pay

## 2023-12-03 MED ORDER — BUPRENORPHINE HCL-NALOXONE HCL 8-2 MG SL FILM
1.0000 | ORAL_FILM | Freq: Three times a day (TID) | SUBLINGUAL | 0 refills | Status: DC
  Filled 2023-12-03: qty 90, 30d supply, fill #0

## 2023-12-17 ENCOUNTER — Other Ambulatory Visit: Payer: Self-pay

## 2023-12-17 ENCOUNTER — Other Ambulatory Visit (HOSPITAL_BASED_OUTPATIENT_CLINIC_OR_DEPARTMENT_OTHER): Payer: Self-pay

## 2023-12-18 ENCOUNTER — Other Ambulatory Visit (HOSPITAL_BASED_OUTPATIENT_CLINIC_OR_DEPARTMENT_OTHER): Payer: Self-pay

## 2023-12-21 ENCOUNTER — Other Ambulatory Visit (HOSPITAL_COMMUNITY): Payer: Self-pay

## 2023-12-31 ENCOUNTER — Other Ambulatory Visit (HOSPITAL_BASED_OUTPATIENT_CLINIC_OR_DEPARTMENT_OTHER): Payer: Self-pay

## 2023-12-31 MED ORDER — BUPRENORPHINE HCL-NALOXONE HCL 8-2 MG SL FILM
1.0000 | ORAL_FILM | Freq: Three times a day (TID) | SUBLINGUAL | 0 refills | Status: DC
  Filled 2023-12-31: qty 90, 30d supply, fill #0

## 2024-01-01 ENCOUNTER — Other Ambulatory Visit (HOSPITAL_BASED_OUTPATIENT_CLINIC_OR_DEPARTMENT_OTHER): Payer: Self-pay

## 2024-01-01 ENCOUNTER — Other Ambulatory Visit: Payer: Self-pay

## 2024-01-01 MED ORDER — BUSPIRONE HCL 15 MG PO TABS
15.0000 mg | ORAL_TABLET | Freq: Three times a day (TID) | ORAL | 0 refills | Status: AC
  Filled 2024-01-01: qty 90, 30d supply, fill #0

## 2024-01-01 MED ORDER — GABAPENTIN 300 MG PO CAPS
600.0000 mg | ORAL_CAPSULE | Freq: Three times a day (TID) | ORAL | 2 refills | Status: AC
  Filled 2024-01-17: qty 180, 30d supply, fill #0

## 2024-01-01 MED ORDER — SERTRALINE HCL 100 MG PO TABS
200.0000 mg | ORAL_TABLET | Freq: Every morning | ORAL | 0 refills | Status: AC
  Filled 2024-02-04: qty 180, 90d supply, fill #0

## 2024-01-04 ENCOUNTER — Other Ambulatory Visit (HOSPITAL_BASED_OUTPATIENT_CLINIC_OR_DEPARTMENT_OTHER): Payer: Self-pay

## 2024-01-18 ENCOUNTER — Other Ambulatory Visit (HOSPITAL_BASED_OUTPATIENT_CLINIC_OR_DEPARTMENT_OTHER): Payer: Self-pay

## 2024-01-18 ENCOUNTER — Encounter (HOSPITAL_BASED_OUTPATIENT_CLINIC_OR_DEPARTMENT_OTHER): Payer: Self-pay

## 2024-01-18 ENCOUNTER — Other Ambulatory Visit: Payer: Self-pay

## 2024-01-25 ENCOUNTER — Other Ambulatory Visit (HOSPITAL_BASED_OUTPATIENT_CLINIC_OR_DEPARTMENT_OTHER): Payer: Self-pay

## 2024-01-27 ENCOUNTER — Other Ambulatory Visit: Payer: Self-pay

## 2024-01-27 ENCOUNTER — Other Ambulatory Visit (HOSPITAL_BASED_OUTPATIENT_CLINIC_OR_DEPARTMENT_OTHER): Payer: Self-pay

## 2024-01-27 MED ORDER — BUPRENORPHINE HCL-NALOXONE HCL 8-2 MG SL FILM
1.0000 | ORAL_FILM | Freq: Three times a day (TID) | SUBLINGUAL | 0 refills | Status: DC
  Filled 2024-01-27 – 2024-01-28 (×2): qty 90, 30d supply, fill #0

## 2024-01-28 ENCOUNTER — Other Ambulatory Visit (HOSPITAL_BASED_OUTPATIENT_CLINIC_OR_DEPARTMENT_OTHER): Payer: Self-pay

## 2024-02-04 ENCOUNTER — Other Ambulatory Visit (HOSPITAL_BASED_OUTPATIENT_CLINIC_OR_DEPARTMENT_OTHER): Payer: Self-pay

## 2024-02-04 ENCOUNTER — Other Ambulatory Visit: Payer: Self-pay

## 2024-02-04 MED ORDER — FENOFIBRATE 145 MG PO TABS
145.0000 mg | ORAL_TABLET | Freq: Every day | ORAL | 1 refills | Status: AC
  Filled 2024-02-04: qty 90, 90d supply, fill #0

## 2024-02-05 ENCOUNTER — Other Ambulatory Visit (HOSPITAL_BASED_OUTPATIENT_CLINIC_OR_DEPARTMENT_OTHER): Payer: Self-pay

## 2024-02-05 MED ORDER — BUDESONIDE-FORMOTEROL FUMARATE 80-4.5 MCG/ACT IN AERO
1.0000 | INHALATION_SPRAY | Freq: Two times a day (BID) | RESPIRATORY_TRACT | 6 refills | Status: AC
Start: 2024-02-05 — End: ?
  Filled 2024-02-05 – 2024-02-09 (×3): qty 10.2, 30d supply, fill #0

## 2024-02-09 ENCOUNTER — Other Ambulatory Visit (HOSPITAL_BASED_OUTPATIENT_CLINIC_OR_DEPARTMENT_OTHER): Payer: Self-pay

## 2024-02-11 ENCOUNTER — Other Ambulatory Visit (HOSPITAL_BASED_OUTPATIENT_CLINIC_OR_DEPARTMENT_OTHER): Payer: Self-pay

## 2024-02-11 MED ORDER — FLUTICASONE-SALMETEROL 100-50 MCG/ACT IN AEPB
1.0000 | INHALATION_SPRAY | Freq: Two times a day (BID) | RESPIRATORY_TRACT | 8 refills | Status: AC
  Filled 2024-02-11: qty 60, 30d supply, fill #0
  Filled 2024-03-15: qty 60, 30d supply, fill #1
  Filled 2024-04-12: qty 60, 30d supply, fill #2

## 2024-02-15 ENCOUNTER — Other Ambulatory Visit (HOSPITAL_BASED_OUTPATIENT_CLINIC_OR_DEPARTMENT_OTHER): Payer: Self-pay

## 2024-02-21 ENCOUNTER — Other Ambulatory Visit (HOSPITAL_BASED_OUTPATIENT_CLINIC_OR_DEPARTMENT_OTHER): Payer: Self-pay

## 2024-02-22 ENCOUNTER — Other Ambulatory Visit (HOSPITAL_BASED_OUTPATIENT_CLINIC_OR_DEPARTMENT_OTHER): Payer: Self-pay

## 2024-02-22 ENCOUNTER — Other Ambulatory Visit: Payer: Self-pay

## 2024-02-22 MED ORDER — LIDOCAINE 5 % EX PTCH
MEDICATED_PATCH | CUTANEOUS | 2 refills | Status: AC
  Filled 2024-02-22 – 2024-02-24 (×2): qty 30, 30d supply, fill #0
  Filled 2024-03-21: qty 30, 30d supply, fill #1

## 2024-02-24 ENCOUNTER — Other Ambulatory Visit: Payer: Self-pay

## 2024-02-24 ENCOUNTER — Other Ambulatory Visit (HOSPITAL_BASED_OUTPATIENT_CLINIC_OR_DEPARTMENT_OTHER): Payer: Self-pay

## 2024-02-24 MED ORDER — ATORVASTATIN CALCIUM 10 MG PO TABS
10.0000 mg | ORAL_TABLET | Freq: Every day | ORAL | 1 refills | Status: AC
  Filled 2024-02-24: qty 90, 90d supply, fill #0

## 2024-02-25 ENCOUNTER — Other Ambulatory Visit: Payer: Self-pay

## 2024-02-25 ENCOUNTER — Other Ambulatory Visit (HOSPITAL_BASED_OUTPATIENT_CLINIC_OR_DEPARTMENT_OTHER): Payer: Self-pay

## 2024-02-25 MED ORDER — BUPRENORPHINE HCL-NALOXONE HCL 8-2 MG SL FILM
1.0000 | ORAL_FILM | Freq: Three times a day (TID) | SUBLINGUAL | 0 refills | Status: DC
  Filled 2024-02-25: qty 90, 30d supply, fill #0

## 2024-03-03 ENCOUNTER — Other Ambulatory Visit (HOSPITAL_BASED_OUTPATIENT_CLINIC_OR_DEPARTMENT_OTHER): Payer: Self-pay

## 2024-03-09 ENCOUNTER — Other Ambulatory Visit (HOSPITAL_BASED_OUTPATIENT_CLINIC_OR_DEPARTMENT_OTHER): Payer: Self-pay

## 2024-03-15 ENCOUNTER — Other Ambulatory Visit: Payer: Self-pay

## 2024-03-15 ENCOUNTER — Other Ambulatory Visit (HOSPITAL_BASED_OUTPATIENT_CLINIC_OR_DEPARTMENT_OTHER): Payer: Self-pay

## 2024-03-15 MED ORDER — OMEGA-3-ACID ETHYL ESTERS 1 G PO CAPS
ORAL_CAPSULE | ORAL | 1 refills | Status: AC
  Filled 2024-03-15: qty 90, 90d supply, fill #0

## 2024-03-21 ENCOUNTER — Other Ambulatory Visit (HOSPITAL_BASED_OUTPATIENT_CLINIC_OR_DEPARTMENT_OTHER): Payer: Self-pay

## 2024-03-22 ENCOUNTER — Other Ambulatory Visit (HOSPITAL_BASED_OUTPATIENT_CLINIC_OR_DEPARTMENT_OTHER): Payer: Self-pay

## 2024-03-22 MED ORDER — BUPRENORPHINE HCL-NALOXONE HCL 8-2 MG SL FILM
ORAL_FILM | SUBLINGUAL | 0 refills | Status: DC
  Filled 2024-03-23: qty 90, 30d supply, fill #0

## 2024-03-23 ENCOUNTER — Other Ambulatory Visit: Payer: Self-pay

## 2024-03-23 ENCOUNTER — Other Ambulatory Visit (HOSPITAL_BASED_OUTPATIENT_CLINIC_OR_DEPARTMENT_OTHER): Payer: Self-pay

## 2024-04-19 ENCOUNTER — Other Ambulatory Visit (HOSPITAL_BASED_OUTPATIENT_CLINIC_OR_DEPARTMENT_OTHER): Payer: Self-pay

## 2024-04-19 ENCOUNTER — Other Ambulatory Visit: Payer: Self-pay

## 2024-04-19 MED ORDER — SERTRALINE HCL 100 MG PO TABS
200.0000 mg | ORAL_TABLET | Freq: Every morning | ORAL | 0 refills | Status: AC
  Filled 2024-04-19: qty 180, 90d supply, fill #0

## 2024-04-19 MED ORDER — BUSPIRONE HCL 15 MG PO TABS
15.0000 mg | ORAL_TABLET | Freq: Three times a day (TID) | ORAL | 0 refills | Status: AC
  Filled 2024-04-19: qty 90, 30d supply, fill #0

## 2024-04-19 MED ORDER — GABAPENTIN 300 MG PO CAPS
600.0000 mg | ORAL_CAPSULE | Freq: Three times a day (TID) | ORAL | 2 refills | Status: AC
  Filled 2024-04-19: qty 180, 30d supply, fill #0

## 2024-04-21 ENCOUNTER — Other Ambulatory Visit (HOSPITAL_BASED_OUTPATIENT_CLINIC_OR_DEPARTMENT_OTHER): Payer: Self-pay

## 2024-04-21 ENCOUNTER — Other Ambulatory Visit: Payer: Self-pay

## 2024-04-21 MED ORDER — BUPRENORPHINE HCL-NALOXONE HCL 8-2 MG SL FILM
1.0000 | ORAL_FILM | Freq: Three times a day (TID) | SUBLINGUAL | 0 refills | Status: AC
  Filled 2024-04-21: qty 90, 30d supply, fill #0
